# Patient Record
Sex: Male | Born: 1985 | Race: White | Hispanic: No | Marital: Single | Smoking: Never smoker
Health system: Southern US, Community
[De-identification: ages and names within clinical notes are randomized; demographics above are authoritative.]

## PROBLEM LIST (undated history)

## (undated) DIAGNOSIS — Z9109 Other allergy status, other than to drugs and biological substances: Secondary | ICD-10-CM

## (undated) DIAGNOSIS — K589 Irritable bowel syndrome without diarrhea: Secondary | ICD-10-CM

## (undated) DIAGNOSIS — F411 Generalized anxiety disorder: Secondary | ICD-10-CM

## (undated) DIAGNOSIS — J309 Allergic rhinitis, unspecified: Secondary | ICD-10-CM

## (undated) DIAGNOSIS — J45909 Unspecified asthma, uncomplicated: Secondary | ICD-10-CM

## (undated) DIAGNOSIS — E669 Obesity, unspecified: Secondary | ICD-10-CM

## (undated) DIAGNOSIS — F909 Attention-deficit hyperactivity disorder, unspecified type: Secondary | ICD-10-CM

## (undated) DIAGNOSIS — J019 Acute sinusitis, unspecified: Secondary | ICD-10-CM

## (undated) HISTORY — DX: Allergic rhinitis, unspecified: J30.9

## (undated) HISTORY — DX: Unspecified asthma, uncomplicated: J45.909

## (undated) HISTORY — DX: Obesity, unspecified: E66.9

## (undated) HISTORY — DX: Attention-deficit hyperactivity disorder, unspecified type: F90.9

## (undated) HISTORY — DX: Acute sinusitis, unspecified: J01.90

## (undated) HISTORY — DX: Generalized anxiety disorder: F41.1

## (undated) HISTORY — DX: Other allergy status, other than to drugs and biological substances: Z91.09

## (undated) HISTORY — DX: Irritable bowel syndrome without diarrhea: K58.9

## (undated) HISTORY — PX: TONSILLECTOMY: SUR1361

## (undated) HISTORY — PX: STOMACH SURGERY: SHX791

## (undated) HISTORY — PX: UPPER GASTROINTESTINAL ENDOSCOPY: SHX188

---
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
## 2007-03-19 ENCOUNTER — Encounter: Payer: Self-pay | Admitting: Internal Medicine

## 2007-03-19 DIAGNOSIS — Z9109 Other allergy status, other than to drugs and biological substances: Secondary | ICD-10-CM | POA: Insufficient documentation

## 2007-03-19 DIAGNOSIS — K589 Irritable bowel syndrome without diarrhea: Secondary | ICD-10-CM | POA: Insufficient documentation

## 2007-03-19 DIAGNOSIS — E669 Obesity, unspecified: Secondary | ICD-10-CM | POA: Insufficient documentation

## 2007-03-19 HISTORY — DX: Obesity, unspecified: E66.9

## 2007-03-19 HISTORY — DX: Other allergy status, other than to drugs and biological substances: Z91.09

## 2007-03-19 HISTORY — DX: Irritable bowel syndrome, unspecified: K58.9

## 2007-03-21 DIAGNOSIS — J309 Allergic rhinitis, unspecified: Secondary | ICD-10-CM | POA: Insufficient documentation

## 2007-03-21 DIAGNOSIS — F411 Generalized anxiety disorder: Secondary | ICD-10-CM

## 2007-03-21 DIAGNOSIS — F419 Anxiety disorder, unspecified: Secondary | ICD-10-CM | POA: Insufficient documentation

## 2007-03-21 HISTORY — DX: Allergic rhinitis, unspecified: J30.9

## 2007-03-21 HISTORY — DX: Generalized anxiety disorder: F41.1

## 2007-05-12 ENCOUNTER — Ambulatory Visit: Payer: Self-pay | Admitting: Internal Medicine

## 2007-05-12 ENCOUNTER — Encounter: Payer: Self-pay | Admitting: Internal Medicine

## 2008-03-01 ENCOUNTER — Ambulatory Visit: Payer: Self-pay | Admitting: Internal Medicine

## 2008-03-01 DIAGNOSIS — R109 Unspecified abdominal pain: Secondary | ICD-10-CM | POA: Insufficient documentation

## 2008-03-06 LAB — CONVERTED CEMR LAB
ALT: 17 units/L (ref 0–53)
AST: 14 units/L (ref 0–37)
Albumin: 4.6 g/dL (ref 3.5–5.2)
Alkaline Phosphatase: 46 units/L (ref 39–117)
Amylase: 92 units/L (ref 27–131)
BUN: 15 mg/dL (ref 6–23)
Bacteria, UA: NEGATIVE
Basophils Absolute: 0 10*3/uL (ref 0.0–0.1)
Basophils Relative: 0.7 % (ref 0.0–3.0)
Bilirubin Urine: NEGATIVE
Bilirubin, Direct: 0.2 mg/dL (ref 0.0–0.3)
CO2: 30 meq/L (ref 19–32)
Calcium: 9.6 mg/dL (ref 8.4–10.5)
Chloride: 104 meq/L (ref 96–112)
Creatinine, Ser: 1.2 mg/dL (ref 0.4–1.5)
Crystals: NEGATIVE
Eosinophils Absolute: 0.2 10*3/uL (ref 0.0–0.7)
Eosinophils Relative: 2.9 % (ref 0.0–5.0)
GFR calc Af Amer: 99 mL/min
GFR calc non Af Amer: 82 mL/min
Glucose, Bld: 99 mg/dL (ref 70–99)
H Pylori IgG: POSITIVE — AB
HCT: 44 % (ref 39.0–52.0)
Hemoglobin, Urine: NEGATIVE
Hemoglobin: 15.6 g/dL (ref 13.0–17.0)
Ketones, ur: NEGATIVE mg/dL
Leukocytes, UA: NEGATIVE
Lipase: 30 units/L (ref 11.0–59.0)
Lymphocytes Relative: 28.3 % (ref 12.0–46.0)
MCHC: 35.3 g/dL (ref 30.0–36.0)
MCV: 97.8 fL (ref 78.0–100.0)
Monocytes Absolute: 0.6 10*3/uL (ref 0.1–1.0)
Monocytes Relative: 9.1 % (ref 3.0–12.0)
Mucus, UA: NEGATIVE
Neutro Abs: 4.2 10*3/uL (ref 1.4–7.7)
Neutrophils Relative %: 59 % (ref 43.0–77.0)
Nitrite: NEGATIVE
Platelets: 255 10*3/uL (ref 150–400)
Potassium: 4.8 meq/L (ref 3.5–5.1)
RBC: 4.5 M/uL (ref 4.22–5.81)
RDW: 12.1 % (ref 11.5–14.6)
Sodium: 140 meq/L (ref 135–145)
Specific Gravity, Urine: 1.015 (ref 1.000–1.03)
Squamous Epithelial / HPF: NEGATIVE /lpf
TSH: 2.39 microintl units/mL (ref 0.35–5.50)
Total Bilirubin: 1.7 mg/dL — ABNORMAL HIGH (ref 0.3–1.2)
Total Protein, Urine: NEGATIVE mg/dL
Total Protein: 7.6 g/dL (ref 6.0–8.3)
Urine Glucose: NEGATIVE mg/dL
Urobilinogen, UA: 0.2 (ref 0.0–1.0)
WBC: 7 10*3/uL (ref 4.5–10.5)
pH: 5 (ref 5.0–8.0)

## 2008-03-07 ENCOUNTER — Encounter: Payer: Self-pay | Admitting: Internal Medicine

## 2008-05-16 ENCOUNTER — Ambulatory Visit: Payer: Self-pay | Admitting: Internal Medicine

## 2008-12-11 ENCOUNTER — Ambulatory Visit: Payer: Self-pay | Admitting: Internal Medicine

## 2008-12-12 DIAGNOSIS — J45909 Unspecified asthma, uncomplicated: Secondary | ICD-10-CM

## 2008-12-12 HISTORY — DX: Unspecified asthma, uncomplicated: J45.909

## 2009-12-27 ENCOUNTER — Ambulatory Visit: Payer: Self-pay | Admitting: Internal Medicine

## 2009-12-27 DIAGNOSIS — J019 Acute sinusitis, unspecified: Secondary | ICD-10-CM | POA: Insufficient documentation

## 2009-12-27 HISTORY — DX: Acute sinusitis, unspecified: J01.90

## 2010-02-19 ENCOUNTER — Ambulatory Visit: Payer: Self-pay | Admitting: Internal Medicine

## 2010-02-19 DIAGNOSIS — H6690 Otitis media, unspecified, unspecified ear: Secondary | ICD-10-CM | POA: Insufficient documentation

## 2010-02-21 ENCOUNTER — Ambulatory Visit: Payer: Self-pay | Admitting: Internal Medicine

## 2010-09-03 NOTE — Assessment & Plan Note (Signed)
Summary: TETANUS SHOT/Aadhav Uhlig/CD   Nurse Visit   Allergies: 1)  ! Benadryl  Immunization History:  Hepatitis B Immunization History:    Hepatitis B # 1:  historical (05/14/1999)    Hepatitis B # 2:  historical (07/12/1999)    Hepatitis B # 3:  historical (11/07/1999)  DPT Immunization History:    DPT # 1:  dpt (04/29/1988)    DPT # 2:  dpt (07/04/1988)    DPT # 3:  dpt (09/19/1988)    DPT # 4:  dpt (05/27/1989)    DPT # 5:  dpt (11/30/1992)  HIB Immunization History:    HIB # 1:  hib (01/29/1990)  Polio Immunization History:    Polio # 1:  historical (04/29/1988)    Polio # 2:  historical (07/04/1988)    Polio # 3:  historical (05/27/1989)    Polio # 4:  historical (11/30/1992)  MMR Immunization History:    MMR # 1:  mmr (05/27/1989)    MMR # 2:  mmr (11/30/1992)  Immunizations Administered:  Tetanus Vaccine:    Vaccine Type: Tdap    Site: right deltoid    Mfr: Merck    Dose: 0.5 ml    Route: IM    Given by: Lamar Sprinkles, CMA    Exp. Date: 10/26/2011    Lot #: [id number]    VIS given: 06/22/07 version given February 21, 2010.  Orders Added: 1)  Tdap => 22yrs IM [90715] 2)  Admin 1st Vaccine [84132]

## 2010-09-03 NOTE — Assessment & Plan Note (Signed)
Summary: ?SINUS INFECTION-LB   Vital Signs:  Patient profile:   25 year old male Height:      69 inches Weight:      183 pounds BMI:     27.12 O2 Sat:      97 % on Room air Temp:     98.7 degrees F oral Pulse rate:   75 / minute BP sitting:   102 / 68  (left arm) Cuff size:   regular  Vitals Entered By: Zella Ball Ewing CMA Duncan Dull) (February 19, 2010 2:42 PM)  O2 Flow:  Room air CC: Sinus Infection/RE   CC:  Sinus Infection/RE.  History of Present Illness: here with 2 days onset acute mild to mod facial pain, pressure, fever and greenish d/c, on top of several wk more clear nasal d/c and sinus congestion with sneezing;  no increased wheezing or sob/doe or nighttime awakening;  Pt denies CP, sob, doe, wheezing, orthopnea, pnd, worsening LE edema, palps, dizziness or syncope   Overall good complaicne with meds, good tolerance.    Problems Prior to Update: 1)  Otitis Media, Left  (ICD-382.9) 2)  Sinusitis- Acute-nos  (ICD-461.9) 3)  Sinusitis- Acute-nos  (ICD-461.9) 4)  Asthma  (ICD-493.90) 5)  Abdominal Pain Other Specified Site  (ICD-789.09) 6)  Family History of Asthma  (ICD-V17.5) 7)  Anxiety  (ICD-300.00) 8)  Allergic Rhinitis  (ICD-477.9) 9)  Obesity  (ICD-278.00) 10)  Allergy, Hx of  (ICD-V15.09) 11)  Ibs  (ICD-564.1)  Medications Prior to Update: 1)  Omeprazole 20 Mg  Cpdr (Omeprazole) .Marland Kitchen.. 1 By Mouth Two Times A Day 2)  Prednisone 10 Mg Tabs (Prednisone) .... 3po Qd For 3days, Then 2po Qd For 3days, Then 1po Qd For 3days, Then Stop 3)  Proair Hfa 108 (90 Base) Mcg/act Aers (Albuterol Sulfate) .... 2 Puffs Four Times Per Day 4)  Advair Diskus 100-50 Mcg/dose Misc (Fluticasone-Salmeterol) .Marland Kitchen.. 1 Puff Two Times A Day 5)  Cephalexin 500 Mg Caps (Cephalexin) .Marland Kitchen.. 1po Three Times A Day 6)  Sudahist 12-120 Mg Xr12h-Tab (Chlorpheniramine-Pseudoeph) .Marland Kitchen.. 1 By Mouth Two Times A Day As Needed  Current Medications (verified): 1)  Omeprazole 20 Mg  Cpdr (Omeprazole) .Marland Kitchen.. 1 By Mouth Two  Times A Day 2)  Proair Hfa 108 (90 Base) Mcg/act Aers (Albuterol Sulfate) .... 2 Puffs Four Times Per Day 3)  Advair Diskus 100-50 Mcg/dose Misc (Fluticasone-Salmeterol) .Marland Kitchen.. 1 Puff Two Times A Day 4)  Augmentin 875-125 Mg Tabs (Amoxicillin-Pot Clavulanate) .... Generic - 1 By Mouth Two Times A Day 5)  Sudahist 12-120 Mg Xr12h-Tab (Chlorpheniramine-Pseudoeph) .Marland Kitchen.. 1 By Mouth Two Times A Day As Needed 6)  Allegra 180 Mg Tabs (Fexofenadine Hcl)  Allergies (verified): 1)  ! Benadryl  Past History:  Past Medical History: Last updated: 12/11/2008 Allergic rhinitis Obesity IBS Anxiety Asthma  Past Surgical History: Last updated: 03/01/2008 Tonsillectomy s/p gastric surgury as toddler for malposition  Social History: Last updated: 12/27/2009 Never Smoked Alcohol use-no Single college student Drug use-no  Risk Factors: Smoking Status: never (05/12/2007)  Review of Systems       all otherwise negative per pt -    Physical Exam  General:  alert and well-developed.  , mild ill  Head:  normocephalic and atraumatic.   Eyes:  vision grossly intact, pupils equal, and pupils round.   Ears:  bilat tm's red, sinus tender bilat Nose:  nasal dischargemucosal pallor and mucosal edema.   Mouth:  pharyngeal erythema and fair dentition.   Neck:  supple  and no masses.   Lungs:  normal respiratory effort and normal breath sounds.   Heart:  normal rate and regular rhythm.   Extremities:  no edema, no erythema    Impression & Recommendations:  Problem # 1:  SINUSITIS- ACUTE-NOS (ICD-461.9)  His updated medication list for this problem includes:    Augmentin 875-125 Mg Tabs (Amoxicillin-pot clavulanate) .Marland Kitchen... Generic - 1 by mouth two times a day    Sudahist 12-120 Mg Xr12h-tab (Chlorpheniramine-pseudoeph) .Marland Kitchen... 1 by mouth two times a day as needed treat as above, f/u any worsening signs or symptoms   Problem # 2:  ASTHMA (ICD-493.90)  The following medications were removed from  the medication list:    Prednisone 10 Mg Tabs (Prednisone) .Marland Kitchen... 3po qd for 3days, then 2po qd for 3days, then 1po qd for 3days, then stop His updated medication list for this problem includes:    Proair Hfa 108 (90 Base) Mcg/act Aers (Albuterol sulfate) .Marland Kitchen... 2 puffs four times per day    Advair Diskus 100-50 Mcg/dose Misc (Fluticasone-salmeterol) .Marland Kitchen... 1 puff two times a day stable overall by hx and exam, ok to continue meds/tx as is   Problem # 3:  ALLERGIC RHINITIS (ICD-477.9)  ok for OTC med such as allegra OTC as needed   His updated medication list for this problem includes:    Allegra 180 Mg Tabs (Fexofenadine hcl)  Complete Medication List: 1)  Omeprazole 20 Mg Cpdr (Omeprazole) .Marland Kitchen.. 1 by mouth two times a day 2)  Proair Hfa 108 (90 Base) Mcg/act Aers (Albuterol sulfate) .... 2 puffs four times per day 3)  Advair Diskus 100-50 Mcg/dose Misc (Fluticasone-salmeterol) .Marland Kitchen.. 1 puff two times a day 4)  Augmentin 875-125 Mg Tabs (Amoxicillin-pot clavulanate) .... Generic - 1 by mouth two times a day 5)  Sudahist 12-120 Mg Xr12h-tab (Chlorpheniramine-pseudoeph) .Marland Kitchen.. 1 by mouth two times a day as needed 6)  Allegra 180 Mg Tabs (Fexofenadine hcl)  Patient Instructions: 1)  Please take all new medications as prescribed 2)  Continue all previous medications as before this visit  3)  You can also use Mucinex OTC or it's generic for congestion  4)  Please schedule a follow-up appointment as needed. Prescriptions: SUDAHIST 12-120 MG XR12H-TAB (CHLORPHENIRAMINE-PSEUDOEPH) 1 by mouth two times a day as needed  #60 x 1   Entered and Authorized by:   Corwin Levins MD   Signed by:   Corwin Levins MD on 02/19/2010   Method used:   Print then Give to Patient   RxID:   [id number] AUGMENTIN 875-125 MG TABS (AMOXICILLIN-POT CLAVULANATE) generic - 1 by mouth two times a day  #20 x 0   Entered and Authorized by:   Corwin Levins MD   Signed by:   Corwin Levins MD on 02/19/2010   Method used:    Electronically to        [facility] [phone]* (retail)       [address]       [place], [place]  [address]       Ph: [phone]       Fax: [phone]   RxID:   [id number]

## 2010-09-03 NOTE — Assessment & Plan Note (Signed)
Summary: cold symptoms  fever---stc   Vital Signs:  Patient profile:   25 year old male Height:      69 inches Weight:      187 pounds BMI:     27.71 O2 Sat:      97 % on Room air Temp:     98.3 degrees F oral Pulse rate:   62 / minute BP sitting:   100 / 58  (left arm) Cuff size:   regular  Vitals Entered ByZella Ball Ewing (Dec 27, 2009 2:16 PM)  O2 Flow:  Room air CC: Congestion, fever, ears stopped up/RE   CC:  Congestion, fever, and ears stopped up/RE.  History of Present Illness: here with acute onset mild to mod x 3 days facial pain, pressure,fever and greenish d/c, with left ear pain with mild popping but no hearing loss, vertigo or n/v., all on top of more chronic nasal allergy symtpoms with itch, sneeze adn mild congestion.   Pt denies CP, sob, doe, wheezing, orthopnea, pnd, worsening LE edema, palps, dizziness or syncope   Pt denies new neuro symptoms such as headache, facial or extremity weakness   Preventive Screening-Counseling & Management      Drug Use:  no.    Problems Prior to Update: 1)  Sinusitis- Acute-nos  (ICD-461.9) 2)  Asthma  (ICD-493.90) 3)  Abdominal Pain Other Specified Site  (ICD-789.09) 4)  Family History of Asthma  (ICD-V17.5) 5)  Anxiety  (ICD-300.00) 6)  Allergic Rhinitis  (ICD-477.9) 7)  Obesity  (ICD-278.00) 8)  Allergy, Hx of  (ICD-V15.09) 9)  Ibs  (ICD-564.1)  Medications Prior to Update: 1)  Omeprazole 20 Mg  Cpdr (Omeprazole) .Marland Kitchen.. 1 By Mouth Two Times A Day 2)  Prednisone 10 Mg Tabs (Prednisone) .... 3po Qd For 3days, Then 2po Qd For 3days, Then 1po Qd For 3days, Then Stop 3)  Proair Hfa 108 (90 Base) Mcg/act Aers (Albuterol Sulfate) .... 2 Puffs Four Times Per Day 4)  Advair Diskus 100-50 Mcg/dose Misc (Fluticasone-Salmeterol) .Marland Kitchen.. 1 Puff Two Times A Day  Current Medications (verified): 1)  Omeprazole 20 Mg  Cpdr (Omeprazole) .Marland Kitchen.. 1 By Mouth Two Times A Day 2)  Prednisone 10 Mg Tabs (Prednisone) .... 3po Qd For 3days, Then 2po Qd  For 3days, Then 1po Qd For 3days, Then Stop 3)  Proair Hfa 108 (90 Base) Mcg/act Aers (Albuterol Sulfate) .... 2 Puffs Four Times Per Day 4)  Advair Diskus 100-50 Mcg/dose Misc (Fluticasone-Salmeterol) .Marland Kitchen.. 1 Puff Two Times A Day 5)  Cephalexin 500 Mg Caps (Cephalexin) .Marland Kitchen.. 1po Three Times A Day 6)  Sudahist 12-120 Mg Xr12h-Tab (Chlorpheniramine-Pseudoeph) .Marland Kitchen.. 1 By Mouth Two Times A Day As Needed  Allergies (verified): 1)  ! Benadryl  Past History:  Past Medical History: Last updated: 12/11/2008 Allergic rhinitis Obesity IBS Anxiety Asthma  Past Surgical History: Last updated: 03/01/2008 Tonsillectomy s/p gastric surgury as toddler for malposition  Social History: Last updated: 12/27/2009 Never Smoked Alcohol use-no Single college student Drug use-no  Risk Factors: Smoking Status: never (05/12/2007)  Social History: Never Smoked Alcohol use-no Single college student Drug use-no Drug Use:  no  Review of Systems       all otherwise negative per pt -    Physical Exam  General:  alert and overweight-appearing. , mild ill  Head:  normocephalic and atraumatic.   Eyes:  vision grossly intact, pupils equal, and pupils round.   Ears:  left tm severe erythema, right tm ok, sinus tender bilat Nose:  nasal dischargemucosal pallor and mucosal edema.   Mouth:  pharyngeal erythema and fair dentition.   Neck:  supple and no masses.   Lungs:  normal respiratory effort and normal breath sounds.   Heart:  normal rate and regular rhythm.   Extremities:  no edema, no erythema    Impression & Recommendations:  Problem # 1:  SINUSITIS- ACUTE-NOS (ICD-461.9)  and left otitis - treat as above, f/u any worsening signs or symptoms , and sudahist, and otc mucinex as needed   His updated medication list for this problem includes:    Cephalexin 500 Mg Caps (Cephalexin) .Marland Kitchen... 1po three times a day    Sudahist 12-120 Mg Xr12h-tab (Chlorpheniramine-pseudoeph) .Marland Kitchen... 1 by mouth two  times a day as needed  Problem # 2:  ASTHMA (ICD-493.90)  His updated medication list for this problem includes:    Prednisone 10 Mg Tabs (Prednisone) .Marland Kitchen... 3po qd for 3days, then 2po qd for 3days, then 1po qd for 3days, then stop    Proair Hfa 108 (90 Base) Mcg/act Aers (Albuterol sulfate) .Marland Kitchen... 2 puffs four times per day    Advair Diskus 100-50 Mcg/dose Misc (Fluticasone-salmeterol) .Marland Kitchen... 1 puff two times a day stable overall by hx and exam, ok to continue meds/tx as is   Problem # 3:  ALLERGIC RHINITIS (ICD-477.9) d/w pt - for otc claritin as needed , consider singulair  Complete Medication List: 1)  Omeprazole 20 Mg Cpdr (Omeprazole) .Marland Kitchen.. 1 by mouth two times a day 2)  Prednisone 10 Mg Tabs (Prednisone) .... 3po qd for 3days, then 2po qd for 3days, then 1po qd for 3days, then stop 3)  Proair Hfa 108 (90 Base) Mcg/act Aers (Albuterol sulfate) .... 2 puffs four times per day 4)  Advair Diskus 100-50 Mcg/dose Misc (Fluticasone-salmeterol) .Marland Kitchen.. 1 puff two times a day 5)  Cephalexin 500 Mg Caps (Cephalexin) .Marland Kitchen.. 1po three times a day 6)  Sudahist 12-120 Mg Xr12h-tab (Chlorpheniramine-pseudoeph) .Marland Kitchen.. 1 by mouth two times a day as needed  Patient Instructions: 1)  Please take all new medications as prescribed 2)  Continue all previous medications as before this visit  3)  Please schedule a follow-up appointment as needed. Prescriptions: SUDAHIST 12-120 MG XR12H-TAB (CHLORPHENIRAMINE-PSEUDOEPH) 1 by mouth two times a day as needed  #60 x 0   Entered and Authorized by:   Corwin Levins MD   Signed by:   Corwin Levins MD on 12/27/2009   Method used:   Print then Give to Patient   RxID:   [id number] CEPHALEXIN 500 MG CAPS (CEPHALEXIN) 1po three times a day  #30 x 0   Entered and Authorized by:   Corwin Levins MD   Signed by:   Corwin Levins MD on 12/27/2009   Method used:   Print then Give to Patient   RxID:   [id number]

## 2011-05-31 DIAGNOSIS — Z0279 Encounter for issue of other medical certificate: Secondary | ICD-10-CM

## 2011-06-19 ENCOUNTER — Encounter: Payer: Self-pay | Admitting: Internal Medicine

## 2011-06-19 ENCOUNTER — Ambulatory Visit (INDEPENDENT_AMBULATORY_CARE_PROVIDER_SITE_OTHER): Payer: BC Managed Care – PPO | Admitting: Internal Medicine

## 2011-06-19 VITALS — BP 112/78 | HR 57 | Temp 98.2°F | Ht 67.0 in | Wt 178.1 lb

## 2011-06-19 DIAGNOSIS — Z0000 Encounter for general adult medical examination without abnormal findings: Secondary | ICD-10-CM | POA: Insufficient documentation

## 2011-06-19 DIAGNOSIS — J45909 Unspecified asthma, uncomplicated: Secondary | ICD-10-CM

## 2011-06-19 DIAGNOSIS — J019 Acute sinusitis, unspecified: Secondary | ICD-10-CM

## 2011-06-19 DIAGNOSIS — J309 Allergic rhinitis, unspecified: Secondary | ICD-10-CM

## 2011-06-19 DIAGNOSIS — Z0001 Encounter for general adult medical examination with abnormal findings: Secondary | ICD-10-CM | POA: Insufficient documentation

## 2011-06-19 MED ORDER — AZITHROMYCIN 250 MG PO TABS: ORAL_TABLET | ORAL | Status: AC

## 2011-06-19 NOTE — Patient Instructions (Signed)
Take all new medications as prescribed Continue all other medications as before You can also take Delsym OTC for cough, and/or Mucinex (or it's generic off brand) for congestion  

## 2011-06-22 ENCOUNTER — Encounter: Payer: Self-pay | Admitting: Internal Medicine

## 2011-06-22 NOTE — Assessment & Plan Note (Signed)
Mild to mod, for antibx course,  to f/u any worsening symptoms or concerns 

## 2011-06-22 NOTE — Assessment & Plan Note (Signed)
stable overall by hx and exam, most recent data reviewed with pt, and pt to continue medical treatment as before - to restart meds

## 2011-06-22 NOTE — Assessment & Plan Note (Signed)
stable overall by hx and exam, most recent data reviewed with pt, and pt to continue medical treatment as before  SpO2 Readings from Last 3 Encounters:  06/19/11 98%  02/19/10 97%  12/27/09 97%

## 2011-06-22 NOTE — Progress Notes (Signed)
  Subjective:    Patient ID: Maxwell Herrera, male    DOB: Sep 02, 1985, 25 y.o.   MRN: [id number]  HPI   Here with 3 days acute onset fever, facial pain, pressure, general weakness and malaise, and greenish d/c, with slight ST, but little to no cough and Pt denies chest pain, increased sob or doe, wheezing, orthopnea, PND, increased LE swelling, palpitations, dizziness or syncope.  Does have several wks ongoing nasal allergy symptoms with clear congestion, itch and sneeze, without fever, pain, ST, cough or wheezing.  Pt denies new neurological symptoms such as new headache, or facial or extremity weakness or numbness   Pt denies polydipsia, polyuria, or low sugar symptoms such as weakness or confusion improved with po intake.  Pt states overall good compliance with meds, trying to follow lower cholesterol, diabetic diet, wt overall stable but little exercise however.    Past Medical History  Diagnosis Date  . ALLERGIC RHINITIS 03/21/2007  . ALLERGY, HX OF 03/19/2007  . ANXIETY 03/21/2007  . ASTHMA 12/12/2008  . IBS 03/19/2007  . OBESITY 03/19/2007  . SINUSITIS- ACUTE-NOS 12/27/2009   Past Surgical History  Procedure Date  . Tonsillectomy   . Stomach surgery     s/p as toddler for malposition    reports that he has never smoked. He does not have any smokeless tobacco history on file. He reports that he does not drink alcohol or use illicit drugs. family history includes Asthma in his other. Allergies  Allergen Reactions  . Diphenhydramine Hcl    Review of Systems Review of Systems  Constitutional: Negative for diaphoresis and unexpected weight change.  HENT: Negative for drooling and tinnitus.   Eyes: Negative for photophobia and visual disturbance.  Respiratory: Negative for choking and stridor.   Gastrointestinal: Negative for vomiting and blood in stool.  Genitourinary: Negative for hematuria and decreased urine volume.      Objective:   Physical Exam BP 112/78  Pulse 57  Temp(Src)  98.2 F (36.8 C) (Oral)  Ht 5\' 7"  (1.702 m)  Wt 178 lb 1.9 oz (80.795 kg)  BMI 27.90 kg/m2  SpO2 98% Physical Exam  VS noted, mild ill Constitutional: Pt appears well-developed and well-nourished.  HENT: Head: Normocephalic.  Right Ear: External ear normal.  Left Ear: External ear normal.  Bilat tm's mild erythema.  Sinus tender bilat maxillary.  Pharynx mild erythema Eyes: Conjunctivae and EOM are normal. Pupils are equal, round, and reactive to light.  Neck: Normal range of motion. Neck supple.  Cardiovascular: Normal rate and regular rhythm.   Pulmonary/Chest: Effort normal and breath sounds normal.  Neurological: Pt is alert. No cranial nerve deficit.  Skin: Skin is warm. No erythema.  Psychiatric: Pt behavior is normal. Thought content normal. not nervous or depressed affect    Assessment & Plan:

## 2011-08-11 ENCOUNTER — Encounter: Payer: Self-pay | Admitting: Internal Medicine

## 2011-08-11 ENCOUNTER — Ambulatory Visit (INDEPENDENT_AMBULATORY_CARE_PROVIDER_SITE_OTHER): Payer: BC Managed Care – PPO | Admitting: Internal Medicine

## 2011-08-11 VITALS — BP 110/70 | HR 52 | Temp 98.3°F | Ht 68.0 in | Wt 181.0 lb

## 2011-08-11 DIAGNOSIS — H6690 Otitis media, unspecified, unspecified ear: Secondary | ICD-10-CM

## 2011-08-11 DIAGNOSIS — H6692 Otitis media, unspecified, left ear: Secondary | ICD-10-CM | POA: Insufficient documentation

## 2011-08-11 DIAGNOSIS — J45909 Unspecified asthma, uncomplicated: Secondary | ICD-10-CM

## 2011-08-11 DIAGNOSIS — F909 Attention-deficit hyperactivity disorder, unspecified type: Secondary | ICD-10-CM

## 2011-08-11 HISTORY — DX: Attention-deficit hyperactivity disorder, unspecified type: F90.9

## 2011-08-11 MED ORDER — ALBUTEROL SULFATE HFA 108 (90 BASE) MCG/ACT IN AERS: 2.0000 | INHALATION_SPRAY | Freq: Four times a day (QID) | RESPIRATORY_TRACT | Status: DC

## 2011-08-11 MED ORDER — LEVOFLOXACIN 250 MG PO TABS: 250.0000 mg | ORAL_TABLET | Freq: Every day | ORAL | Status: AC

## 2011-08-11 MED ORDER — AMPHETAMINE-DEXTROAMPHET ER 20 MG PO CP24: 20.0000 mg | ORAL_CAPSULE | ORAL | Status: DC

## 2011-08-11 MED ORDER — HYDROCODONE-HOMATROPINE 5-1.5 MG/5ML PO SYRP: 5.0000 mL | ORAL_SOLUTION | Freq: Four times a day (QID) | ORAL | Status: AC | PRN

## 2011-08-11 NOTE — Progress Notes (Signed)
Subjective:    Patient ID: Maxwell Herrera, male    DOB: 1986/04/09, 26 y.o.   MRN: [id number]  HPI   Here with 3 days acute onset fever, facial  pressure, general weakness and malaise with mild ST but severe left ear pain, but little to no cough and Pt denies chest pain, increased sob or doe, wheezing, orthopnea, PND, increased LE swelling, palpitations, dizziness or syncope.  Pt denies new neurological symptoms such as new headache, or facial or extremity weakness or numbness   Pt denies polydipsia, polyuria.   Pt denies fever, wt loss, night sweats, loss of appetite, or other constitutional symptoms except for the above.  Left ear achesas well.  Also with hx of ADHD, job becoming more demanding with more responsibility (working as Consulting civil engineer for [employer], office based) and would like to re-start tx.  Has taken adderal, last at approx 26yo with good results academically.   Past Medical History  Diagnosis Date  . ALLERGIC RHINITIS 03/21/2007  . ALLERGY, HX OF 03/19/2007  . ANXIETY 03/21/2007  . ASTHMA 12/12/2008  . IBS 03/19/2007  . OBESITY 03/19/2007  . SINUSITIS- ACUTE-NOS 12/27/2009  . ADHD (attention deficit hyperactivity disorder) 08/11/2011   Past Surgical History  Procedure Date  . Tonsillectomy   . Stomach surgery     s/p as toddler for malposition    reports that he has never smoked. He does not have any smokeless tobacco history on file. He reports that he does not drink alcohol or use illicit drugs. family history includes Asthma in his other. Allergies  Allergen Reactions  . Diphenhydramine Hcl    Current Outpatient Prescriptions on File Prior to Visit  Medication Sig Dispense Refill  . Chlorpheniramine-Pseudoeph (SUDAHIST) 12-120 MG TB12 Take 1 tablet by mouth 2 (two) times daily as needed.        . fexofenadine (ALLEGRA) 180 MG tablet Take 180 mg by mouth daily.        . Fluticasone-Salmeterol (ADVAIR DISKUS) 100-50 MCG/DOSE AEPB Inhale 1 puff into the lungs 2 (two) times daily.          Marland Kitchen omeprazole (PRILOSEC) 20 MG capsule Take 20 mg by mouth 2 (two) times daily.         Review of Systems Review of Systems  Constitutional: Negative for diaphoresis and unexpected weight change.  HENT: Negative for drooling and tinnitus.   Eyes: Negative for photophobia and visual disturbance.  Respiratory: Negative for choking and stridor.   Gastrointestinal: Negative for vomiting and blood in stool.  Genitourinary: Negative for hematuria and decreased urine volume.     Objective:   Physical Exam BP 110/70  Pulse 52  Temp(Src) 98.3 F (36.8 C) (Oral)  Ht 5\' 8"  (1.727 m)  Wt 181 lb (82.101 kg)  BMI 27.52 kg/m2  SpO2 97% Physical Exam  VS noted Constitutional: Pt appears well-developed and well-nourished.  HENT: Head: Normocephalic.  Right Ear: External ear normal.  Left Ear: External ear normal.  Bilat tm's mild erythema.  Sinus nontender.  Pharynx mild erythema left MT severe erythema, bulging Eyes: Conjunctivae and EOM are normal. Pupils are equal, round, and reactive to light.  Neck: Normal range of motion. Neck supple.  Cardiovascular: Normal rate and regular rhythm.   Pulmonary/Chest: Effort normal and breath sounds normal.  Neurological: Pt is alert. No cranial nerve deficit.  Skin: Skin is warm. No erythema.  Psychiatric: Pt behavior is normal. Thought content normal. 1+ nervous    Assessment & Plan:

## 2011-08-11 NOTE — Assessment & Plan Note (Signed)
Treatment with adderall xr 20 qd,  to f/u any worsening symptoms or concerns

## 2011-08-11 NOTE — Assessment & Plan Note (Signed)
Moderate to severe, for antibx course,  to f/u any worsening symptoms or concerns

## 2011-08-11 NOTE — Patient Instructions (Addendum)
Take all new medications as prescribed, including the adderall XR 20 mg Please call if the adderall works OK for refills Continue all other medications as before You can also take Mucinex (or it's generic off brand) for congestion

## 2011-08-11 NOTE — Assessment & Plan Note (Signed)
stable overall by hx and exam, most recent data reviewed with pt, and pt to continue medical treatment as before, for inhaler refill SpO2 Readings from Last 3 Encounters:  08/11/11 97%  06/19/11 98%  02/19/10 97%

## 2011-08-27 ENCOUNTER — Telehealth: Payer: Self-pay

## 2011-08-27 NOTE — Telephone Encounter (Signed)
Initiated approval of Adderall and was approved from Jan. 2, 2013 through Jan. 23, 2014 Case number is [id number]. Called pharmacy informed of approval. Also called patient left message regarding approval.

## 2011-11-19 ENCOUNTER — Encounter: Payer: Self-pay | Admitting: Internal Medicine

## 2011-11-19 ENCOUNTER — Ambulatory Visit (INDEPENDENT_AMBULATORY_CARE_PROVIDER_SITE_OTHER): Payer: BC Managed Care – PPO | Admitting: Internal Medicine

## 2011-11-19 ENCOUNTER — Other Ambulatory Visit (INDEPENDENT_AMBULATORY_CARE_PROVIDER_SITE_OTHER): Payer: BC Managed Care – PPO

## 2011-11-19 VITALS — BP 118/72 | HR 53 | Temp 97.5°F | Ht 69.0 in | Wt 177.5 lb

## 2011-11-19 DIAGNOSIS — F909 Attention-deficit hyperactivity disorder, unspecified type: Secondary | ICD-10-CM

## 2011-11-19 DIAGNOSIS — Z0000 Encounter for general adult medical examination without abnormal findings: Secondary | ICD-10-CM

## 2011-11-19 LAB — BASIC METABOLIC PANEL
BUN: 19 mg/dL (ref 6–23)
GFR: 89.47 mL/min (ref >60.00)
Potassium: 4.1 mEq/L (ref 3.5–5.1)
Sodium: 140 mEq/L (ref 135–145)

## 2011-11-19 LAB — CBC WITH DIFFERENTIAL/PLATELET
Basophils Absolute: 0 10*3/uL (ref 0.0–0.1)
Basophils Relative: 0.6 % (ref 0.0–3.0)
Eosinophils Relative: 1.7 % (ref 0.0–5.0)
Hemoglobin: 15 g/dL (ref 13.0–17.0)
Lymphocytes Relative: 30 % (ref 12.0–46.0)
Monocytes Relative: 9.7 % (ref 3.0–12.0)
Neutro Abs: 4.5 10*3/uL (ref 1.4–7.7)
RBC: 4.5 Mil/uL (ref 4.22–5.81)
RDW: 13 % (ref 11.5–14.6)
WBC: 7.8 10*3/uL (ref 4.5–10.5)

## 2011-11-19 LAB — HEPATIC FUNCTION PANEL
AST: 14 U/L (ref 0–37)
Bilirubin, Direct: 0.2 mg/dL (ref 0.0–0.3)
Total Bilirubin: 1.2 mg/dL (ref 0.3–1.2)

## 2011-11-19 LAB — URINALYSIS, ROUTINE W REFLEX MICROSCOPIC
Leukocytes, UA: NEGATIVE
Specific Gravity, Urine: 1.015 (ref 1.000–1.030)
Urine Glucose: NEGATIVE
Urobilinogen, UA: 0.2 (ref 0.0–1.0)
pH: 6 (ref 5.0–8.0)

## 2011-11-19 LAB — LIPID PANEL
Cholesterol: 152 mg/dL (ref 0–200)
LDL Cholesterol: 95 mg/dL (ref 0–99)
Total CHOL/HDL Ratio: 5
VLDL: 24.2 mg/dL (ref 0.0–40.0)

## 2011-11-19 MED ORDER — AMPHETAMINE-DEXTROAMPHET ER 20 MG PO CP24: 20.0000 mg | ORAL_CAPSULE | ORAL | Status: DC

## 2011-11-19 NOTE — Patient Instructions (Signed)
Take all new medications as prescribed Continue all other medications as before Please have the pharmacy call with any refills you may need. Please go to LAB in the Basement for the blood and/or urine tests to be done today You will be contacted by phone if any changes need to be made immediately.  Otherwise, you will receive a letter about your results with an explanation. Please return in 1 year for your yearly visit, or sooner if needed, with Lab testing done 3-5 days before  

## 2011-11-23 ENCOUNTER — Encounter: Payer: Self-pay | Admitting: Internal Medicine

## 2011-11-23 NOTE — Progress Notes (Signed)
Subjective:    Patient ID: Maxwell Herrera, male    DOB: 1986-02-26, 26 y.o.   MRN: [id number]  HPI  Here for wellness and f/u;  Overall doing ok;  Pt denies CP, worsening SOB, DOE, wheezing, orthopnea, PND, worsening LE edema, palpitations, dizziness or syncope.  Pt denies neurological change such as new Headache, facial or extremity weakness.  Pt denies polydipsia, polyuria, or low sugar symptoms. Pt states overall good compliance with treatment and medications, good tolerability, and trying to follow lower cholesterol diet.  Pt denies worsening depressive symptoms, suicidal ideation or panic. No fever, wt loss, night sweats, loss of appetite, or other constitutional symptoms.  Pt states good ability with ADL's, low fall risk, home safety reviewed and adequate, no significant changes in hearing or vision, and occasionally active with exercise.  Still works for [employer], gets more work done on [employer], less mistakes, friends notice the improvement including stepdad. Recent wt stable.  Did have minor insomnia with taking a dose to close to bedtime once. Past Medical History  Diagnosis Date  . ALLERGIC RHINITIS 03/21/2007  . ALLERGY, HX OF 03/19/2007  . ANXIETY 03/21/2007  . ASTHMA 12/12/2008  . IBS 03/19/2007  . OBESITY 03/19/2007  . SINUSITIS- ACUTE-NOS 12/27/2009  . ADHD (attention deficit hyperactivity disorder) 08/11/2011   Past Surgical History  Procedure Date  . Tonsillectomy   . Stomach surgery     s/p as toddler for malposition    reports that he has never smoked. He does not have any smokeless tobacco history on file. He reports that he does not drink alcohol or use illicit drugs. family history includes Asthma in his other. Allergies  Allergen Reactions  . Diphenhydramine Hcl    Current Outpatient Prescriptions on File Prior to Visit  Medication Sig Dispense Refill  . albuterol (PROAIR HFA) 108 (90 BASE) MCG/ACT inhaler Inhale 2 puffs into the lungs 4 (four) times daily.  1 Inhaler   5  . Chlorpheniramine-Pseudoeph (SUDAHIST) 12-120 MG TB12 Take 1 tablet by mouth 2 (two) times daily as needed.        . fexofenadine (ALLEGRA) 180 MG tablet Take 180 mg by mouth daily.        . Fluticasone-Salmeterol (ADVAIR DISKUS) 100-50 MCG/DOSE AEPB Inhale 1 puff into the lungs 2 (two) times daily.        Marland Kitchen omeprazole (PRILOSEC) 20 MG capsule Take 20 mg by mouth 2 (two) times daily.        Marland Kitchen amphetamine-dextroamphetamine (ADDERALL XR) 20 MG 24 hr capsule Take 1 capsule (20 mg total) by mouth every morning. To fill January 18, 2012  30 capsule  0   Review of Systems Review of Systems  Constitutional: Negative for diaphoresis, activity change, appetite change and unexpected weight change.  HENT: Negative for hearing loss, ear pain, facial swelling, mouth sores and neck stiffness.   Eyes: Negative for pain, redness and visual disturbance.  Respiratory: Negative for shortness of breath and wheezing.   Cardiovascular: Negative for chest pain and palpitations.  Gastrointestinal: Negative for diarrhea, blood in stool, abdominal distention and rectal pain.  Genitourinary: Negative for hematuria, flank pain and decreased urine volume.  Musculoskeletal: Negative for myalgias and joint swelling.  Skin: Negative for color change and wound.  Neurological: Negative for syncope and numbness.  Hematological: Negative for adenopathy.  Psychiatric/Behavioral: Negative for hallucinations, self-injury, decreased concentration and agitation.      Objective:   Physical Exam BP 118/72  Pulse 53  Temp(Src) 97.5 F (  36.4 C) (Oral)  Ht 5\' 9"  (1.753 m)  Wt 177 lb 8 oz (80.513 kg)  BMI 26.21 kg/m2  SpO2 98% Physical Exam  VS noted Constitutional: Pt is oriented to person, place, and time. Appears well-developed and well-nourished.  HENT:  Head: Normocephalic and atraumatic.  Right Ear: External ear normal.  Left Ear: External ear normal.  Nose: Nose normal.  Mouth/Throat: Oropharynx is clear and  moist.  Eyes: Conjunctivae and EOM are normal. Pupils are equal, round, and reactive to light.  Neck: Normal range of motion. Neck supple. No JVD present. No tracheal deviation present.  Cardiovascular: Normal rate, regular rhythm, normal heart sounds and intact distal pulses.   Pulmonary/Chest: Effort normal and breath sounds normal.  Abdominal: Soft. Bowel sounds are normal. There is no tenderness.  Musculoskeletal: Normal range of motion. Exhibits no edema.  Lymphadenopathy:  Has no cervical adenopathy.  Neurological: Pt is alert and oriented to person, place, and time. Pt has normal reflexes. No cranial nerve deficit.  Skin: Skin is warm and dry. No rash noted.  Psychiatric:  Has  normal mood and affect. Behavior is normal.     Assessment & Plan:

## 2011-11-23 NOTE — Assessment & Plan Note (Signed)

## 2011-11-23 NOTE — Assessment & Plan Note (Signed)
stable overall by hx and exam, most recent data reviewed with pt, and pt to continue medical treatment as before, cont adderall

## 2012-02-11 ENCOUNTER — Emergency Department (HOSPITAL_BASED_OUTPATIENT_CLINIC_OR_DEPARTMENT_OTHER)
Admission: EM | Admit: 2012-02-11 | Discharge: 2012-02-11 | Disposition: A | Discharge status: Home / Self Care | Payer: No Typology Code available for payment source | Attending: Emergency Medicine | Admitting: Emergency Medicine

## 2012-02-11 ENCOUNTER — Emergency Department (HOSPITAL_BASED_OUTPATIENT_CLINIC_OR_DEPARTMENT_OTHER): Payer: No Typology Code available for payment source

## 2012-02-11 ENCOUNTER — Encounter (HOSPITAL_BASED_OUTPATIENT_CLINIC_OR_DEPARTMENT_OTHER): Payer: Self-pay | Admitting: *Deleted

## 2012-02-11 DIAGNOSIS — J45909 Unspecified asthma, uncomplicated: Secondary | ICD-10-CM | POA: Insufficient documentation

## 2012-02-11 DIAGNOSIS — F909 Attention-deficit hyperactivity disorder, unspecified type: Secondary | ICD-10-CM | POA: Insufficient documentation

## 2012-02-11 DIAGNOSIS — E669 Obesity, unspecified: Secondary | ICD-10-CM | POA: Insufficient documentation

## 2012-02-11 DIAGNOSIS — M25519 Pain in unspecified shoulder: Secondary | ICD-10-CM | POA: Insufficient documentation

## 2012-02-11 DIAGNOSIS — M542 Cervicalgia: Secondary | ICD-10-CM | POA: Insufficient documentation

## 2012-02-11 MED ORDER — IBUPROFEN 200 MG PO TABS: 600.0000 mg | ORAL_TABLET | Freq: Three times a day (TID) | ORAL | Status: DC | PRN

## 2012-02-11 MED ORDER — CYCLOBENZAPRINE HCL 5 MG PO TABS: 10.0000 mg | ORAL_TABLET | Freq: Three times a day (TID) | ORAL | Status: AC | PRN
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
## 2012-02-11 NOTE — ED Provider Notes (Signed)
I saw and evaluated the patient, reviewed the resident's note and I agree with the findings and plan.   Rolan Bucco, MD 02/11/12 [phone]

## 2012-02-17 ENCOUNTER — Encounter: Payer: Self-pay | Admitting: Internal Medicine

## 2012-02-17 ENCOUNTER — Ambulatory Visit (INDEPENDENT_AMBULATORY_CARE_PROVIDER_SITE_OTHER): Payer: BC Managed Care – PPO | Admitting: Internal Medicine

## 2012-02-17 VITALS — BP 100/70 | HR 57 | Temp 98.4°F | Ht 70.0 in | Wt 180.1 lb

## 2012-02-17 DIAGNOSIS — F909 Attention-deficit hyperactivity disorder, unspecified type: Secondary | ICD-10-CM

## 2012-02-17 DIAGNOSIS — M549 Dorsalgia, unspecified: Secondary | ICD-10-CM | POA: Insufficient documentation

## 2012-02-17 DIAGNOSIS — F411 Generalized anxiety disorder: Secondary | ICD-10-CM

## 2012-02-17 MED ORDER — IBUPROFEN 200 MG PO TABS: 600.0000 mg | ORAL_TABLET | Freq: Three times a day (TID) | ORAL | Status: AC | PRN

## 2012-02-17 NOTE — Patient Instructions (Addendum)
Continue all other medications as before You are given the return to work note The FMLA form will filled out as well and returned when it arrives

## 2012-02-18 DIAGNOSIS — Z0279 Encounter for issue of other medical certificate: Secondary | ICD-10-CM

## 2012-02-29 ENCOUNTER — Encounter: Payer: Self-pay | Admitting: Internal Medicine

## 2012-02-29 NOTE — Assessment & Plan Note (Signed)
Not currently taking adderall, doing ok socially and work,  to f/u any worsening symptoms or concerns
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
## 2012-02-29 NOTE — Assessment & Plan Note (Signed)
stable overall by hx and exam, most recent data reviewed with pt, and pt to continue medical treatment as before Lab Results  Component Value Date   WBC 7.8 11/19/2011   HGB 15.0 11/19/2011   HCT 43.4 11/19/2011   PLT 249.0 11/19/2011   GLUCOSE 72 11/19/2011   CHOL 152 11/19/2011   TRIG 121.0 11/19/2011   HDL 33.00* 11/19/2011   LDLCALC 95 11/19/2011   ALT 19 11/19/2011   AST 14 11/19/2011   NA 140 11/19/2011   K 4.1 11/19/2011   CL 105 11/19/2011   CREATININE 1.1 11/19/2011   BUN 19 11/19/2011   CO2 29 11/19/2011   TSH 2.69 11/19/2011    
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
## 2012-03-21 ENCOUNTER — Emergency Department (HOSPITAL_BASED_OUTPATIENT_CLINIC_OR_DEPARTMENT_OTHER)
Admission: EM | Admit: 2012-03-21 | Discharge: 2012-03-21 | Disposition: A | Discharge status: Home / Self Care | Payer: BC Managed Care – PPO | Attending: Emergency Medicine | Admitting: Emergency Medicine

## 2012-03-21 ENCOUNTER — Encounter (HOSPITAL_BASED_OUTPATIENT_CLINIC_OR_DEPARTMENT_OTHER): Payer: Self-pay | Admitting: *Deleted

## 2012-03-21 DIAGNOSIS — E669 Obesity, unspecified: Secondary | ICD-10-CM | POA: Insufficient documentation

## 2012-03-21 DIAGNOSIS — J45909 Unspecified asthma, uncomplicated: Secondary | ICD-10-CM | POA: Insufficient documentation

## 2012-03-21 DIAGNOSIS — K589 Irritable bowel syndrome without diarrhea: Secondary | ICD-10-CM | POA: Insufficient documentation

## 2012-03-21 DIAGNOSIS — R1031 Right lower quadrant pain: Secondary | ICD-10-CM | POA: Insufficient documentation

## 2012-03-21 DIAGNOSIS — Z825 Family history of asthma and other chronic lower respiratory diseases: Secondary | ICD-10-CM | POA: Insufficient documentation

## 2012-03-21 DIAGNOSIS — Z888 Allergy status to other drugs, medicaments and biological substances status: Secondary | ICD-10-CM | POA: Insufficient documentation

## 2012-03-21 DIAGNOSIS — R109 Unspecified abdominal pain: Secondary | ICD-10-CM

## 2012-03-21 DIAGNOSIS — F411 Generalized anxiety disorder: Secondary | ICD-10-CM | POA: Insufficient documentation

## 2012-03-21 DIAGNOSIS — F909 Attention-deficit hyperactivity disorder, unspecified type: Secondary | ICD-10-CM | POA: Insufficient documentation

## 2012-03-21 LAB — URINALYSIS, ROUTINE W REFLEX MICROSCOPIC
Ketones, ur: NEGATIVE mg/dL
Leukocytes, UA: NEGATIVE
Nitrite: NEGATIVE
Protein, ur: NEGATIVE mg/dL
pH: 7 (ref 5.0–8.0)

## 2012-03-21 MED ORDER — DICYCLOMINE HCL 10 MG PO CAPS
10.0000 mg | ORAL_CAPSULE | Freq: Once | ORAL | Status: AC
  Administered 2012-03-21: 10 mg via ORAL
  Filled 2012-03-21 (×2): qty 1

## 2012-03-21 MED ORDER — DICYCLOMINE HCL 20 MG PO TABS: 20.0000 mg | ORAL_TABLET | Freq: Two times a day (BID) | ORAL | Status: DC

## 2012-03-21 NOTE — ED Notes (Addendum)
Pt c/o LUQ pain onset last pm. Nausea this a.m. Pain increases when attempting to have BM.

## 2012-03-21 NOTE — ED Provider Notes (Signed)
History     CSN: [id number]  Arrival date & time 03/21/12  4098   First MD Initiated Contact with Patient 03/21/12 1830      Chief Complaint  Patient presents with  . Abdominal Pain    (Consider location/radiation/quality/duration/timing/severity/associated sxs/prior treatment) HPI Comments: Patient reports that he has been having intermittent LLQ abdominal pain for the past 2 days.  Pain typically lasts a few minutes and then resolves.  Pain is a sharp cramping type pain.  Pain does not radiate.  No pain of scrotum.  Pain worse when he has a bowel movement.  His last BM was this morning.  He reports that the BM was somewhat smaller and harder than usual.  No blood in his stool.  Prior to that his last bowel movement was 2 days ago.  He reports that he typically has BM daily.  PMH significant for IBS.  He has Miralax at home, but has not tried to take it for his symptoms.  He denies fever or chills.  Denies nausea, vomiting, or diarrhea.  He has not tried taking anything for his pain prior to arrival in the ED.    The history is provided by the patient.    Past Medical History  Diagnosis Date  . ALLERGIC RHINITIS 03/21/2007  . ALLERGY, HX OF 03/19/2007  . ANXIETY 03/21/2007  . ASTHMA 12/12/2008  . IBS 03/19/2007  . OBESITY 03/19/2007  . SINUSITIS- ACUTE-NOS 12/27/2009  . ADHD (attention deficit hyperactivity disorder) 08/11/2011    Past Surgical History  Procedure Date  . Tonsillectomy   . Stomach surgery     s/p as toddler for malposition    Family History  Problem Relation Age of Onset  . Asthma Other     History  Substance Use Topics  . Smoking status: Never Smoker   . Smokeless tobacco: Not on file  . Alcohol Use: No      Review of Systems  Constitutional: Negative for fever and chills.  Gastrointestinal: Positive for abdominal pain and constipation. Negative for nausea, vomiting, diarrhea, blood in stool, abdominal distention and anal bleeding.  Genitourinary:  Negative for dysuria, urgency, frequency, hematuria, flank pain, decreased urine volume, penile swelling, scrotal swelling, penile pain and testicular pain.  Neurological: Negative for dizziness, syncope and light-headedness.    Allergies  Diphenhydramine hcl  Home Medications   Current Outpatient Rx  Name Route Sig Dispense Refill  . AMPHETAMINE-DEXTROAMPHET ER 20 MG PO CP24 Oral Take 1 capsule (20 mg total) by mouth every morning. To fill January 18, 2012 30 capsule 0    BP 120/65  Pulse 52  Temp 98.1 F (36.7 C) (Oral)  Resp 18  Ht 5\' 10"  (1.778 m)  Wt 178 lb (80.74 kg)  BMI 25.54 kg/m2  SpO2 100%  Physical Exam  Nursing note and vitals reviewed. Constitutional: He appears well-developed and well-nourished. No distress.  HENT:  Head: Normocephalic and atraumatic.  Mouth/Throat: Oropharynx is clear and moist.  Cardiovascular: Normal rate, regular rhythm and normal heart sounds.   Pulmonary/Chest: Effort normal and breath sounds normal.  Abdominal: Soft. Normal appearance and bowel sounds are normal. He exhibits no distension and no mass. There is no splenomegaly. There is tenderness in the left lower quadrant. There is no rigidity, no rebound, no guarding and no tenderness at McBurney's point. Hernia confirmed negative in the right inguinal area and confirmed negative in the left inguinal area.  Genitourinary: Right testis shows no mass, no swelling and no tenderness. Left  testis shows no mass, no swelling and no tenderness.  Neurological: He is alert.  Skin: Skin is warm and dry. No rash noted. He is not diaphoretic. No erythema.  Psychiatric: He has a normal mood and affect.    ED Course  Procedures (including critical care time)   Labs Reviewed  URINALYSIS, ROUTINE W REFLEX MICROSCOPIC   No results found.   No diagnosis found.    MDM  Patient presenting with LLQ abdominal pain.  Feel that symptoms most likely related to IBS.  Patient with no rebound or guarding  on exam.  No RLQ abdominal pain.  Pain has been intermittent over the past 2 days.  Patient afebrile.  No vomiting.  Therefore, feel that surgical abdomen is unlikely.  Patient discharged home with prescription for Bentyl.  Return precautions have been discussed.        Pascal Lux [place], PA-C 03/22/12 0201

## 2012-03-22 NOTE — ED Provider Notes (Signed)
Medical screening examination/treatment/procedure(s) were performed by non-physician practitioner and as supervising physician I was immediately available for consultation/collaboration.   Takoda Janowiak B. Nasim Habeeb, MD 03/22/12 1901 

## 2012-04-01 ENCOUNTER — Other Ambulatory Visit: Payer: Self-pay

## 2012-04-01 DIAGNOSIS — F909 Attention-deficit hyperactivity disorder, unspecified type: Secondary | ICD-10-CM

## 2012-04-01 MED ORDER — AMPHETAMINE-DEXTROAMPHET ER 20 MG PO CP24: 20.0000 mg | ORAL_CAPSULE | ORAL | Status: DC

## 2012-04-01 NOTE — Telephone Encounter (Signed)
Done hardcopy to robin  

## 2012-04-01 NOTE — Telephone Encounter (Signed)
Called the patient informed prescription requested has been filled and hardcopy is ready for pickup at the front desk.

## 2012-04-08 ENCOUNTER — Ambulatory Visit (INDEPENDENT_AMBULATORY_CARE_PROVIDER_SITE_OTHER): Payer: BC Managed Care – PPO | Admitting: Internal Medicine

## 2012-04-08 ENCOUNTER — Encounter: Payer: Self-pay | Admitting: Internal Medicine

## 2012-04-08 VITALS — BP 120/80 | HR 54 | Temp 97.9°F | Resp 14 | Wt 177.3 lb

## 2012-04-08 DIAGNOSIS — J45909 Unspecified asthma, uncomplicated: Secondary | ICD-10-CM

## 2012-04-08 DIAGNOSIS — F411 Generalized anxiety disorder: Secondary | ICD-10-CM

## 2012-04-08 DIAGNOSIS — F909 Attention-deficit hyperactivity disorder, unspecified type: Secondary | ICD-10-CM

## 2012-04-08 DIAGNOSIS — K589 Irritable bowel syndrome without diarrhea: Secondary | ICD-10-CM

## 2012-04-08 MED ORDER — ESCITALOPRAM OXALATE 10 MG PO TABS: 10.0000 mg | ORAL_TABLET | Freq: Every day | ORAL | Status: DC

## 2012-04-08 MED ORDER — ALPRAZOLAM 0.5 MG PO TBDP: 0.5000 mg | ORAL_TABLET | Freq: Two times a day (BID) | ORAL | Status: DC | PRN

## 2012-04-08 MED ORDER — ALPRAZOLAM 0.5 MG PO TABS: 0.5000 mg | ORAL_TABLET | Freq: Two times a day (BID) | ORAL | Status: AC | PRN

## 2012-04-08 NOTE — Assessment & Plan Note (Signed)
Mild to mod, for start lexapro 10 qd, with xanax prn panic only, declines counseling,  to f/u any worsening symptoms or concerns

## 2012-04-08 NOTE — Assessment & Plan Note (Signed)
stable overall by hx and exam, most recent data reviewed with pt, and pt to continue medical treatment as before Lab Results  Component Value Date   WBC 7.8 11/19/2011   HGB 15.0 11/19/2011   HCT 43.4 11/19/2011   PLT 249.0 11/19/2011   GLUCOSE 72 11/19/2011   CHOL 152 11/19/2011   TRIG 121.0 11/19/2011   HDL 33.00* 11/19/2011   LDLCALC 95 11/19/2011   ALT 19 11/19/2011   AST 14 11/19/2011   NA 140 11/19/2011   K 4.1 11/19/2011   CL 105 11/19/2011   CREATININE 1.1 11/19/2011   BUN 19 11/19/2011   CO2 29 11/19/2011   TSH 2.69 11/19/2011    

## 2012-04-08 NOTE — Assessment & Plan Note (Signed)
stable overall by hx and exam, most recent data reviewed with pt, and pt to continue medical treatment as before Lab Results  Component Value Date   WBC 7.8 11/19/2011   HGB 15.0 11/19/2011   HCT 43.4 11/19/2011   PLT 249.0 11/19/2011   GLUCOSE 72 11/19/2011   CHOL 152 11/19/2011   TRIG 121.0 11/19/2011   HDL 33.00* 11/19/2011   LDLCALC 95 11/19/2011   ALT 19 11/19/2011   AST 14 11/19/2011   NA 140 11/19/2011   K 4.1 11/19/2011   CL 105 11/19/2011   CREATININE 1.1 11/19/2011   BUN 19 11/19/2011   CO2 29 11/19/2011   TSH 2.69 11/19/2011

## 2012-04-08 NOTE — Patient Instructions (Addendum)
Take all new medications as prescribed Continue all other medications as before Please call in 3-4 weeks if you feel the lexapro should be increased to 20 mg, but watch for any unusual fatigue

## 2012-04-08 NOTE — Assessment & Plan Note (Signed)
stable overall by hx and exam, most recent data reviewed with pt, and pt to continue medical treatment as before SpO2 Readings from Last 3 Encounters:  04/08/12 95%  03/21/12 100%  02/17/12 97%

## 2012-04-08 NOTE — Progress Notes (Signed)
  Subjective:    Patient ID: Maxwell Herrera, male    DOB: 1986/07/13, 26 y.o.   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  Seen in ER recently for abd pain , but Denies worsening reflux, dysphagia, abd pain, n/v, bowel change or blood.  Currently no pain.   Pt denies fever, wt loss, night sweats, loss of appetite, or other constitutional symptoms  Pt denies chest pain, increased sob or doe, wheezing, orthopnea, PND, increased LE swelling, palpitations, dizziness or syncope.   Pt denies polydipsia, polyuria. Past Medical History  Diagnosis Date  . ALLERGIC RHINITIS 03/21/2007  . ALLERGY, HX OF 03/19/2007  . ANXIETY 03/21/2007  . ASTHMA 12/12/2008  . IBS 03/19/2007  . OBESITY 03/19/2007  . SINUSITIS- ACUTE-NOS 12/27/2009  . ADHD (attention deficit hyperactivity disorder) 08/11/2011   Past Surgical History  Procedure Date  . Tonsillectomy   . Stomach surgery     s/p as toddler for malposition    reports that he has never smoked. He does not have any smokeless tobacco history on file. He reports that he does not drink alcohol or use illicit drugs. family history includes Asthma in his other. Allergies  Allergen Reactions  . Diphenhydramine Hcl    Current Outpatient Prescriptions on File Prior to Visit  Medication Sig Dispense Refill  . amphetamine-dextroamphetamine (ADDERALL XR) 20 MG 24 hr capsule Take 1 capsule (20 mg total) by mouth every morning.  30 capsule  0  . dicyclomine (BENTYL) 20 MG tablet Take 1 tablet (20 mg total) by mouth 2 (two) times daily.  20 tablet  0  . escitalopram (LEXAPRO) 10 MG tablet Take 1 tablet (10 mg total) by mouth daily.  90 tablet  3   Review of  Systems Constitutional: Negative for diaphoresis and unexpected weight change.    Eyes: Negative for photophobia and visual disturbance.  Respiratory: Negative for choking and stridor.   Gastrointestinal: Negative for vomiting and blood in stool.  Genitourinary: Negative for hematuria and decreased urine volume.  Musculoskeletal: Negative for gait problem.  Neurological: Negative for tremors and numbness.  Psychiatric/Behavioral: Negative for decreased concentration. The patient is not hyperactive.      Objective:   Physical Exam BP 120/80  Pulse 54  Temp 97.9 F (36.6 C) (Oral)  Resp 14  Wt 177 lb 5 oz (80.428 kg)  SpO2 95% Physical Exam  VS noted Constitutional: Pt appears well-developed and well-nourished.  HENT: Head: Normocephalic.  Right Ear: External ear normal.  Left Ear: External ear normal.  Eyes: Conjunctivae and EOM are normal. Pupils are equal, round, and reactive to light.  Neck: Normal range of motion. Neck supple.  Cardiovascular: Normal rate and regular rhythm.   Pulmonary/Chest: Effort normal and breath sounds normal.  Abd:  Soft, NT, non-distended, + BS Neurological: Pt is alert. Not confused Psychiatric: Pt behavior is normal. Thought content normal. 1-2+ nervous    Assessment & Plan:

## 2012-05-24 ENCOUNTER — Other Ambulatory Visit: Payer: Self-pay

## 2012-05-24 DIAGNOSIS — F909 Attention-deficit hyperactivity disorder, unspecified type: Secondary | ICD-10-CM

## 2012-05-24 MED ORDER — AMPHETAMINE-DEXTROAMPHET ER 20 MG PO CP24: 20.0000 mg | ORAL_CAPSULE | ORAL | Status: DC

## 2012-05-24 NOTE — Telephone Encounter (Signed)
Called the patient informed prescription requested is ready for pickup at the front desk. 

## 2012-05-24 NOTE — Telephone Encounter (Signed)
Done hardcopy to robin  

## 2012-07-09 ENCOUNTER — Ambulatory Visit (INDEPENDENT_AMBULATORY_CARE_PROVIDER_SITE_OTHER): Payer: BC Managed Care – PPO | Admitting: Internal Medicine

## 2012-07-09 ENCOUNTER — Encounter: Payer: Self-pay | Admitting: Internal Medicine

## 2012-07-09 VITALS — BP 112/82 | HR 68 | Temp 98.2°F | Ht 70.0 in | Wt 177.4 lb

## 2012-07-09 DIAGNOSIS — F909 Attention-deficit hyperactivity disorder, unspecified type: Secondary | ICD-10-CM

## 2012-07-09 DIAGNOSIS — B349 Viral infection, unspecified: Secondary | ICD-10-CM

## 2012-07-09 DIAGNOSIS — J45909 Unspecified asthma, uncomplicated: Secondary | ICD-10-CM

## 2012-07-09 DIAGNOSIS — B9789 Other viral agents as the cause of diseases classified elsewhere: Secondary | ICD-10-CM

## 2012-07-09 MED ORDER — ONDANSETRON HCL 4 MG PO TABS: ORAL_TABLET | ORAL | Status: DC

## 2012-07-09 MED ORDER — AMPHETAMINE-DEXTROAMPHET ER 20 MG PO CP24: 20.0000 mg | ORAL_CAPSULE | ORAL | Status: DC

## 2012-07-09 MED ORDER — AMPHETAMINE-DEXTROAMPHET ER 20 MG PO CP24
20.0000 mg | ORAL_CAPSULE | ORAL | Status: DC
Start: 2012-07-09 — End: 2012-07-09

## 2012-07-09 MED ORDER — DIPHENOXYLATE-ATROPINE 2.5-0.025 MG PO TABS: 1.0000 | ORAL_TABLET | Freq: Four times a day (QID) | ORAL | Status: DC | PRN

## 2012-07-09 NOTE — Patient Instructions (Addendum)
Take all new medications as prescribed - the nausea and diarrhea medications Continue all other medications as before, including the adderall refills today Please try to drink small amounts of fluid constantly over the next week to avoid dehydration Please return for any worsening fever, pain, vomiting, or blood, or other symptoms

## 2012-07-10 ENCOUNTER — Encounter: Payer: Self-pay | Admitting: Internal Medicine

## 2012-07-10 DIAGNOSIS — B349 Viral infection, unspecified: Secondary | ICD-10-CM | POA: Insufficient documentation

## 2012-07-10 NOTE — Assessment & Plan Note (Signed)
stable overall by hx and exam, most recent data reviewed with pt, and pt to continue medical treatment as before, for med refills 

## 2012-07-10 NOTE — Assessment & Plan Note (Signed)
stable overall by hx and exam, most recent data reviewed with pt, and pt to continue medical treatment as before SpO2 Readings from Last 3 Encounters:  07/09/12 97%  04/08/12 95%  03/21/12 100%

## 2012-07-10 NOTE — Progress Notes (Signed)
  Subjective:    Patient ID: Maxwell Herrera, male    DOB: 24-Jul-1986, 24 y.o.   MRN: [id number]  HPI  Here with 2 day onset feeling ill with low grade fever, slight ST, marked nausea with vomit x 1 last night only, intermittent crampy abd pains and mult loose stools, sometime watery, without blood, recent wt loss, chills, cough.  Pt denies chest pain, increased sob or doe, wheezing, orthopnea, PND, increased LE swelling, palpitations, dizziness or syncope.   Pt denies polydipsia, polyuria.  Pt denies new neurological symptoms such as new headache, or facial or extremity weakness or numbness.  Asks for adderall refills, overall doing well with good function at work/social, able to concentrate, better task completion ability. Past Medical History  Diagnosis Date  . ALLERGIC RHINITIS 03/21/2007  . ALLERGY, HX OF 03/19/2007  . ANXIETY 03/21/2007  . ASTHMA 12/12/2008  . IBS 03/19/2007  . OBESITY 03/19/2007  . SINUSITIS- ACUTE-NOS 12/27/2009  . ADHD (attention deficit hyperactivity disorder) 08/11/2011   Past Surgical History  Procedure Date  . Tonsillectomy   . Stomach surgery     s/p as toddler for malposition    reports that he has never smoked. He does not have any smokeless tobacco history on file. He reports that he does not drink alcohol or use illicit drugs. family history includes Asthma in his other. Allergies  Allergen Reactions  . Diphenhydramine Hcl    Current Outpatient Prescriptions on File Prior to Visit  Medication Sig Dispense Refill  . dicyclomine (BENTYL) 20 MG tablet Take 1 tablet (20 mg total) by mouth 2 (two) times daily.  20 tablet  0  . amphetamine-dextroamphetamine (ADDERALL XR) 20 MG 24 hr capsule Take 1 capsule (20 mg total) by mouth every morning. To fill Sep 07, 2012  30 capsule  0  . escitalopram (LEXAPRO) 10 MG tablet Take 1 tablet (10 mg total) by mouth daily.  90 tablet  3   Review of Systems  Constitutional: Negative for diaphoresis and unexpected weight change.   HENT: Negative for tinnitus.   Eyes: Negative for photophobia and visual disturbance.  Respiratory: Negative for choking and stridor.   Gastrointestinal: Negative for vomiting and blood in stool.  Genitourinary: Negative for hematuria and decreased urine volume.  Musculoskeletal: Negative for gait problem.  Skin: Negative for color change and wound.  Neurological: Negative for tremors and numbness.  Psychiatric/Behavioral: Negative for decreased concentration. The patient is not hyperactive.       Objective:   Physical Exam BP 112/82  Pulse 68  Temp 98.2 F (36.8 C) (Oral)  Ht 5\' 10"  (1.778 m)  Wt 177 lb 6 oz (80.457 kg)  BMI 25.45 kg/m2  SpO2 97% Physical Exam  VS noted, mild ill Constitutional: Pt appears well-developed and well-nourished.  HENT: Head: Normocephalic.  Right Ear: External ear normal.  Left Ear: External ear normal.  Bilat tm's mild erythema.  Sinus nontender.  Pharynx mild erythema Eyes: Conjunctivae and EOM are normal. Pupils are equal, round, and reactive to light.  Neck: Normal range of motion. Neck supple.  Cardiovascular: Normal rate and regular rhythm.   Pulmonary/Chest: Effort normal and breath sounds normal.  Abd:  Soft, non-distended, + BS with diffuse mild tender some worse LLQ without guarding or rebound Neurological: Pt is alert. Not confused  Skin: Skin is warm. No erythema.  Psychiatric: Pt behavior is normal. Thought content normal.     Assessment & Plan:

## 2012-07-10 NOTE — Assessment & Plan Note (Signed)
C/w acute gastroenteritis, likely viral, for antiemetic/lomotil, push fluids, tylenol prn, . to f/u any worsening symptoms or concerns

## 2012-07-12 ENCOUNTER — Telehealth: Payer: Self-pay

## 2012-07-12 NOTE — Telephone Encounter (Signed)
Completed note called the patient informed to pickup at convenience.

## 2012-07-12 NOTE — Telephone Encounter (Signed)
Ok for note 

## 2012-07-12 NOTE — Telephone Encounter (Signed)
The patient was seen on Friday 07/09/12 and was to get a work note to excuse from work Friday (07/09/12) through today (07/12/12), please advise

## 2012-09-08 ENCOUNTER — Telehealth: Payer: Self-pay | Admitting: Internal Medicine

## 2012-09-08 DIAGNOSIS — F909 Attention-deficit hyperactivity disorder, unspecified type: Secondary | ICD-10-CM

## 2012-09-08 NOTE — Telephone Encounter (Signed)
Patient is requesting a refill on his adderall, call when ready

## 2012-09-09 NOTE — Telephone Encounter (Signed)
Called [phone] for approval of medication.  Received approval from 08/10/12 through 09/09/13 [id number].  Patient will be contacted as well as pharmacy.

## 2012-09-09 NOTE — Telephone Encounter (Signed)
Last refill already done on feb 4

## 2012-09-09 NOTE — Telephone Encounter (Signed)
Informed the patient and I have PA to do for this patient.  Will get to asap.

## 2012-10-26 ENCOUNTER — Encounter: Payer: Self-pay | Admitting: Internal Medicine

## 2012-10-26 ENCOUNTER — Ambulatory Visit (INDEPENDENT_AMBULATORY_CARE_PROVIDER_SITE_OTHER): Payer: BC Managed Care – PPO | Admitting: Internal Medicine

## 2012-10-26 VITALS — BP 102/70 | HR 53 | Temp 97.8°F | Ht 71.0 in | Wt 185.1 lb

## 2012-10-26 DIAGNOSIS — J209 Acute bronchitis, unspecified: Secondary | ICD-10-CM | POA: Insufficient documentation

## 2012-10-26 DIAGNOSIS — F909 Attention-deficit hyperactivity disorder, unspecified type: Secondary | ICD-10-CM

## 2012-10-26 DIAGNOSIS — F411 Generalized anxiety disorder: Secondary | ICD-10-CM

## 2012-10-26 DIAGNOSIS — Z0000 Encounter for general adult medical examination without abnormal findings: Secondary | ICD-10-CM

## 2012-10-26 MED ORDER — HYDROCODONE-HOMATROPINE 5-1.5 MG/5ML PO SYRP: 5.0000 mL | ORAL_SOLUTION | Freq: Four times a day (QID) | ORAL | Status: DC | PRN

## 2012-10-26 MED ORDER — AMPHETAMINE-DEXTROAMPHET ER 20 MG PO CP24: 20.0000 mg | ORAL_CAPSULE | ORAL | Status: DC

## 2012-10-26 MED ORDER — AZITHROMYCIN 250 MG PO TABS: ORAL_TABLET | ORAL | Status: DC

## 2012-10-26 NOTE — Patient Instructions (Addendum)
Please take all new medication as prescribed - the antibiotic, cough med Please continue all other medications as before, and refills have been done if requested - the adderall Thank you for enrolling in MyChart. Please follow the instructions below to securely access your online medical record. MyChart allows you to send messages to your doctor, view your test results, renew your prescriptions, schedule appointments, and more. To Log into My Chart online, please go by [employer] or [employer] to [email or web address], or download the MyChart App from the [employer] of [employer] .  Your Username is: [email or web address] (password - your phone number) Please return in 3 months, or sooner if needed, with Lab testing done 3-5 days before

## 2012-10-26 NOTE — Assessment & Plan Note (Signed)
Mild to mod, for antibx course,  to f/u any worsening symptoms or concerns; gave work note as well

## 2012-10-26 NOTE — Assessment & Plan Note (Signed)
stable overall by history and exam, recent data reviewed with pt, and pt to continue medical treatment as before,  to f/u any worsening symptoms or concerns Lab Results  Component Value Date   WBC 7.8 11/19/2011   HGB 15.0 11/19/2011   HCT 43.4 11/19/2011   PLT 249.0 11/19/2011   GLUCOSE 72 11/19/2011   CHOL 152 11/19/2011   TRIG 121.0 11/19/2011   HDL 33.00* 11/19/2011   LDLCALC 95 11/19/2011   ALT 19 11/19/2011   AST 14 11/19/2011   NA 140 11/19/2011   K 4.1 11/19/2011   CL 105 11/19/2011   CREATININE 1.1 11/19/2011   BUN 19 11/19/2011   CO2 29 11/19/2011   TSH 2.69 11/19/2011   For med refills

## 2012-10-26 NOTE — Progress Notes (Signed)
  Subjective:    Patient ID: Maxwell Herrera, male    DOB: 1985-11-23, 27 y.o.   MRN: [id number]  HPI  Here with acute onset mild to mod 2-3 days ST, HA, general weakness and malaise, with prod cough greenish sputum, but Pt denies chest pain, increased sob or doe, wheezing, orthopnea, PND, increased LE swelling, palpitations, dizziness or syncope.  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 Denies worsening depressive symptoms, suicidal ideation, or panic; has ongoing anxiety, not increased recently.  Past Medical History  Diagnosis Date  . ALLERGIC RHINITIS 03/21/2007  . ALLERGY, HX OF 03/19/2007  . ANXIETY 03/21/2007  . ASTHMA 12/12/2008  . IBS 03/19/2007  . OBESITY 03/19/2007  . SINUSITIS- ACUTE-NOS 12/27/2009  . ADHD (attention deficit hyperactivity disorder) 08/11/2011   Past Surgical History  Procedure Laterality Date  . Tonsillectomy    . Stomach surgery      s/p as toddler for malposition    reports that he has never smoked. He does not have any smokeless tobacco history on file. He reports that he does not drink alcohol or use illicit drugs. family history includes Asthma in his other. Allergies  Allergen Reactions  . Diphenhydramine Hcl    Current Outpatient Prescriptions on File Prior to Visit  Medication Sig Dispense Refill  . escitalopram (LEXAPRO) 10 MG tablet Take 1 tablet (10 mg total) by mouth daily.  90 tablet  3   No current facility-administered medications on file prior to visit.   Review of Systems  Constitutional: Negative for unexpected weight change, or unusual diaphoresis  HENT: Negative for tinnitus.   Eyes: Negative for photophobia and visual disturbance.  Respiratory: Negative for choking and stridor.   Gastrointestinal: Negative for vomiting and blood in stool.  Genitourinary: Negative for hematuria and decreased urine volume.  Musculoskeletal: Negative for acute joint  swelling Skin: Negative for color change and wound.  Neurological: Negative for tremors and numbness other than noted  Psychiatric/Behavioral: Negative for decreased concentration or  hyperactivity.       Objective:   Physical Exam BP 102/70  Pulse 53  Temp(Src) 97.8 F (36.6 C) (Oral)  Ht 5\' 11"  (1.803 m)  Wt 185 lb 2 oz (83.972 kg)  BMI 25.83 kg/m2  SpO2 97% VS noted, mild ill appearing Constitutional: Pt appears well-developed and well-nourished.  HENT: Head: NCAT.  Right Ear: External ear normal.  Left Ear: External ear normal.  Eyes: Conjunctivae and EOM are normal. Pupils are equal, round, and reactive to light.  Neck: Normal range of motion. Neck supple.  Bilat tm's with mild erythema.  Max sinus areas mild tender.  Pharynx with mild erythema, no exudate Cardiovascular: Normal rate and regular rhythm.   Pulmonary/Chest: Effort normal and breath sounds normal.  Neurological: Pt is alert. Not confused  Skin: Skin is warm. No erythema.  Psychiatric: Pt behavior is normal. Thought content normal. not nervous or depressed affect    Assessment & Plan:

## 2012-10-26 NOTE — Assessment & Plan Note (Signed)
Stable on current lexapro without sluggishness, to cont tx as is

## 2012-10-29 ENCOUNTER — Encounter: Payer: Self-pay | Admitting: Internal Medicine

## 2012-11-04 ENCOUNTER — Ambulatory Visit: Payer: BC Managed Care – PPO | Admitting: Internal Medicine

## 2012-11-30 ENCOUNTER — Encounter: Payer: Self-pay | Admitting: *Deleted

## 2012-11-30 ENCOUNTER — Ambulatory Visit (INDEPENDENT_AMBULATORY_CARE_PROVIDER_SITE_OTHER): Payer: BC Managed Care – PPO | Admitting: Internal Medicine

## 2012-11-30 ENCOUNTER — Encounter: Payer: Self-pay | Admitting: Internal Medicine

## 2012-11-30 VITALS — BP 110/72 | HR 59 | Temp 98.1°F | Ht 71.0 in | Wt 186.0 lb

## 2012-11-30 DIAGNOSIS — R6889 Other general symptoms and signs: Secondary | ICD-10-CM

## 2012-11-30 DIAGNOSIS — J111 Influenza due to unidentified influenza virus with other respiratory manifestations: Secondary | ICD-10-CM

## 2012-11-30 DIAGNOSIS — J1189 Influenza due to unidentified influenza virus with other manifestations: Secondary | ICD-10-CM

## 2012-11-30 DIAGNOSIS — J112 Influenza due to unidentified influenza virus with gastrointestinal manifestations: Secondary | ICD-10-CM

## 2012-11-30 NOTE — Progress Notes (Signed)
Subjective:    Patient ID: Maxwell Herrera, male    DOB: March 29, 1986, 27 y.o.   MRN: [id number]  HPI  Pt presents to the clinic today with c/o nausea, diarrhea, fever, body aches and chest congestion. This all started Friday morning. They symptoms seem to have gotten worse over the weekend but have improved somewhat since Monday. The worse part now is the fatigue. He has been taking Dayquil and Nyquil. He is not producing any sputum. He has had contact with a family member who has been diagnosed with flu. He does have a history of allergies and asthma. He did not get his flu shot this year.  Review of Systems  Past Medical History  Diagnosis Date  . ALLERGIC RHINITIS 03/21/2007  . ALLERGY, HX OF 03/19/2007  . ANXIETY 03/21/2007  . ASTHMA 12/12/2008  . IBS 03/19/2007  . OBESITY 03/19/2007  . SINUSITIS- ACUTE-NOS 12/27/2009  . ADHD (attention deficit hyperactivity disorder) 08/11/2011    Current Outpatient Prescriptions  Medication Sig Dispense Refill  . amphetamine-dextroamphetamine (ADDERALL XR) 20 MG 24 hr capsule Take 1 capsule (20 mg total) by mouth every morning. To fill Dec 25, 2012  30 capsule  0  . escitalopram (LEXAPRO) 10 MG tablet Take 1 tablet (10 mg total) by mouth daily.  90 tablet  3   No current facility-administered medications for this visit.    Allergies  Allergen Reactions  . Diphenhydramine Hcl     Family History  Problem Relation Age of Onset  . Asthma Other     History   Social History  . Marital Status: Single    Spouse Name: N/A    Number of Children: N/A  . Years of Education: N/A   Occupational History  . College Student    Social History Main Topics  . Smoking status: Never Smoker   . Smokeless tobacco: Not on file  . Alcohol Use: No  . Drug Use: No  . Sexually Active: Not on file   Other Topics Concern  . Not on file   Social History Narrative  . No narrative on file     Constitutional: Pt reports fatigue, fever and headache. Denies  abrupt weight changes.  HEENT: Denies eye pain, eye redness, ear pain, ringing in the ears, wax buildup, runny nose, nasal congestion, bloody nose, or sore throat. Respiratory: Pt reports chest congestion. Denies difficulty breathing, shortness of breath, cough or sputum production.    Gastrointestinal: Pt reports nausea and diarrhea. Denies abdominal pain, bloating, constipation,  or blood in the stool. . Musculoskeletal: Pt reports generalized body aches. Denies decrease in range of motion, difficulty with gait, muscle pain or joint pain and swelling.   No other specific complaints in a complete review of systems (except as listed in HPI above).  Objective:   Physical Exam  BP 110/72  Pulse 59  Temp(Src) 98.1 F (36.7 C) (Oral)  Ht 5\' 11"  (1.803 m)  Wt 186 lb (84.369 kg)  BMI 25.95 kg/m2  SpO2 97% Wt Readings from Last 3 Encounters:  11/30/12 186 lb (84.369 kg)  10/26/12 185 lb 2 oz (83.972 kg)  07/09/12 177 lb 6 oz (80.457 kg)    General: Appears his stated age, well developed, well nourished in NAD. HEENT: Head: normal shape and size; Eyes: sclera white, no icterus, conjunctiva pink, PERRLA and EOMs intact; Ears: Tm's gray and intact, normal light reflex; Nose: mucosa pink and moist, septum midline; Throat/Mouth: Teeth present, mucosa pink and moist, no exudate, lesions  or ulcerations noted.  Neck: Normal range of motion. Neck supple, trachea midline. No massses, lumps or thyromegaly present.  Cardiovascular: Normal rate and rhythm. S1,S2 noted.  No murmur, rubs or gallops noted. No JVD or BLE edema. No carotid bruits noted. Pulmonary/Chest: Normal effort and positive vesicular breath sounds. No respiratory distress. No wheezes, rales or ronchi noted.  Abdomen: Soft and nontender. Normal bowel sounds, no bruits noted. No distention or masses noted. Liver, spleen and kidneys non palpable. Musculoskeletal: Normal range of motion. No signs of joint swelling. No difficulty with gait.   .        Assessment & Plan:   Flu like symptoms, with known contact with influenza virus, new onset:  Given duration of symptoms, will treat symptomatically Tylenol for fever Rest and stay well hydrated Work note provided Wash hand thoroughly with  Soap and water  RTC as needed or if symptoms persist

## 2012-11-30 NOTE — Patient Instructions (Signed)
Influenza Facts  Flu (influenza) is a contagious respiratory illness caused by the influenza viruses. It can cause mild to severe illness. While most healthy people recover from the flu without specific treatment and without complications, older people, young children, and people with certain health conditions are at higher risk for serious complications from the flu, including death.  CAUSES    The flu virus is spread from person to person by respiratory droplets from coughing and sneezing.   A person can also become infected by touching an object or surface with a virus on it and then touching their mouth, eye or nose.   Adults may be able to infect others from 1 day before symptoms occur and up to 7 days after getting sick. So it is possible to give someone the flu even before you know you are sick and continue to infect others while you are sick.  SYMPTOMS    Fever (usually high).   Headache.   Tiredness (can be extreme).   Cough.   Sore throat.   Runny or stuffy nose.   Body aches.   Diarrhea and vomiting may also occur, particularly in children.   These symptoms are referred to as "flu-like symptoms". A lot of different illnesses, including the common cold, can have similar symptoms.  DIAGNOSIS    There are tests that can determine if you have the flu as long you are tested within the first 2 or 3 days of illness.   A doctor's exam and additional tests may be needed to identify if you have a disease that is a complicating the flu.  RISKS AND COMPLICATIONS   Some of the complications caused by the flu include:   Bacterial pneumonia or progressive pneumonia caused by the flu virus.   Loss of body fluids (dehydration).   Worsening of chronic medical conditions, such as heart failure, asthma, or diabetes.   Sinus problems and ear infections.  HOME CARE INSTRUCTIONS    Seek medical care early on.   If you are at high risk from complications of the flu, consult your health-care provider as soon  as you develop flu-like symptoms. Those at high risk for complications include:   People 65 years or older.   People with chronic medical conditions, including diabetes.   Pregnant women.   Young children.   Your caregiver may recommend use of an antiviral medication to help treat the flu.   If you get the flu, get plenty of rest, drink a lot of liquids, and avoid using alcohol and tobacco.   You can take over-the-counter medications to relieve the symptoms of the flu if your caregiver approves. (Never give aspirin to children or teenagers who have flu-like symptoms, particularly fever).  PREVENTION   The single best way to prevent the flu is to get a flu vaccine each fall. Other measures that can help protect against the flu are:   Antiviral Medications   A number of antiviral drugs are approved for use in preventing the flu. These are prescription medications, and a doctor should be consulted before they are used.   Habits for Good Health   Cover your nose and mouth with a tissue when you cough or sneeze, throw the tissue away after you use it.   Wash your hands often with soap and water, especially after you cough or sneeze. If you are not near water, use an alcohol-based hand cleaner.   Avoid people who are sick.   If you get the   flu, stay home from work or school. Avoid contact with other people so that you do not make them sick, too.   Try not to touch your eyes, nose, or mouth as germs ore often spread this way.  IN CHILDREN, EMERGENCY WARNING SIGNS THAT NEED URGENT MEDICAL ATTENTION:   Fast breathing or trouble breathing.   Bluish skin color.   Not drinking enough fluids.   Not waking up or not interacting.   Being so irritable that the child does not want to be held.   Flu-like symptoms improve but then return with fever and worse cough.   Fever with a rash.  IN ADULTS, EMERGENCY WARNING SIGNS THAT NEED URGENT MEDICAL ATTENTION:   Difficulty breathing or shortness of breath.   Pain  or pressure in the chest or abdomen.   Sudden dizziness.   Confusion.   Severe or persistent vomiting.  SEEK IMMEDIATE MEDICAL CARE IF:   You or someone you know is experiencing any of the symptoms above. When you arrive at the emergency center,report that you think you have the flu. You may be asked to wear a mask and/or sit in a secluded area to protect others from getting sick.  MAKE SURE YOU:    Understand these instructions.   Monitor your condition.   Seek medical care if you are getting worse, or not improving.  Document Released: 07/24/2003 Document Revised: 10/13/2011 Document Reviewed: 04/19/2009  [employer] Patient Information 2013 [employer].

## 2013-01-17 ENCOUNTER — Encounter: Payer: Self-pay | Admitting: Internal Medicine

## 2013-01-18 ENCOUNTER — Telehealth: Payer: Self-pay

## 2013-01-18 NOTE — Telephone Encounter (Signed)
Ok for work note - to robin to handle

## 2013-01-18 NOTE — Telephone Encounter (Signed)
Work note complete.

## 2013-01-26 ENCOUNTER — Encounter: Payer: Self-pay | Admitting: Internal Medicine

## 2013-01-26 ENCOUNTER — Ambulatory Visit (INDEPENDENT_AMBULATORY_CARE_PROVIDER_SITE_OTHER): Payer: BC Managed Care – PPO | Admitting: Internal Medicine

## 2013-01-26 VITALS — BP 100/68 | HR 59 | Temp 98.3°F | Ht 70.0 in | Wt 179.4 lb

## 2013-01-26 DIAGNOSIS — F909 Attention-deficit hyperactivity disorder, unspecified type: Secondary | ICD-10-CM

## 2013-01-26 DIAGNOSIS — F411 Generalized anxiety disorder: Secondary | ICD-10-CM

## 2013-01-26 MED ORDER — ESCITALOPRAM OXALATE 10 MG PO TABS: 10.0000 mg | ORAL_TABLET | Freq: Every day | ORAL | Status: DC

## 2013-01-26 MED ORDER — LORAZEPAM 1 MG PO TABS: 1.0000 mg | ORAL_TABLET | Freq: Every day | ORAL | Status: DC | PRN

## 2013-01-26 NOTE — Progress Notes (Signed)
  Subjective:    Patient ID: Maxwell Herrera, male    DOB: 1985/12/02, 27 y.o.   MRN: [id number]  HPI  Here to f/u after events last wk with mult severe stressors all occuring at one time led to severe panic attack, though did not have to go to ER.  Has few attacks but several per yr.  Overall good compliance with treatment, and good medicine tolerability, including ADD med and lexapro.  Pt denies chest pain, increased sob or doe, wheezing, orthopnea, PND, increased LE swelling, palpitations, dizziness or syncope.  Pt denies new neurological symptoms such as new headache, or facial or extremity weakness or numbness   Pt denies polydipsia, polyuria.  Past Medical History  Diagnosis Date  . ALLERGIC RHINITIS 03/21/2007  . ALLERGY, HX OF 03/19/2007  . ANXIETY 03/21/2007  . ASTHMA 12/12/2008  . IBS 03/19/2007  . OBESITY 03/19/2007  . SINUSITIS- ACUTE-NOS 12/27/2009  . ADHD (attention deficit hyperactivity disorder) 08/11/2011   Past Surgical History  Procedure Laterality Date  . Tonsillectomy    . Stomach surgery      s/p as toddler for malposition    reports that he has never smoked. He does not have any smokeless tobacco history on file. He reports that he does not drink alcohol or use illicit drugs. family history includes Asthma in his other. Allergies  Allergen Reactions  . Diphenhydramine Hcl    Current Outpatient Prescriptions on File Prior to Visit  Medication Sig Dispense Refill  . amphetamine-dextroamphetamine (ADDERALL XR) 20 MG 24 hr capsule Take 1 capsule (20 mg total) by mouth every morning. To fill Dec 25, 2012  30 capsule  0  . escitalopram (LEXAPRO) 10 MG tablet Take 1 tablet (10 mg total) by mouth daily.  90 tablet  3   No current facility-administered medications on file prior to visit.   Review of Systems All otherwise neg per pt     Objective:   Physical Exam BP 100/68  Pulse 59  Temp(Src) 98.3 F (36.8 C) (Oral)  Ht 5\' 10"  (1.778 m)  Wt 179 lb 6 oz (81.364 kg)  BMI  25.74 kg/m2  SpO2 96% VS noted,  Constitutional: Pt appears well-developed and well-nourished.  HENT: Head: NCAT.  Right Ear: External ear normal.  Left Ear: External ear normal.  Eyes: Conjunctivae and EOM are normal. Pupils are equal, round, and reactive to light.  Neck: Normal range of motion. Neck supple.  Cardiovascular: Normal rate and regular rhythm.   Pulmonary/Chest: Effort normal and breath sounds normal.  Abd:  Soft, NT, non-distended, + BS Neurological: Pt is alert. Not confused  Skin: Skin is warm. No erythema.  Psychiatric: Pt behavior is normal. Thought content normal. mild nervous    Assessment & Plan:

## 2013-01-26 NOTE — Patient Instructions (Signed)
Please take all new medication as prescribed - the ativan ONLY if needed  Please continue all other medications as before  Please continue your efforts at being more active, low cholesterol diet, and weight control.  Please return in 1 year for your yearly visit, or sooner if needed

## 2013-01-26 NOTE — Assessment & Plan Note (Signed)
To cont lexapro, and ativan prn panic

## 2013-01-26 NOTE — Assessment & Plan Note (Signed)
stable overall by history and exam, and pt to continue medical treatment as before,  to f/u any worsening symptoms or concerns 

## 2013-02-21 ENCOUNTER — Ambulatory Visit (INDEPENDENT_AMBULATORY_CARE_PROVIDER_SITE_OTHER): Payer: BC Managed Care – PPO | Admitting: Internal Medicine

## 2013-02-21 ENCOUNTER — Encounter: Payer: Self-pay | Admitting: Internal Medicine

## 2013-02-21 VITALS — BP 112/70 | HR 49 | Temp 97.0°F | Ht 70.0 in | Wt 185.1 lb

## 2013-02-21 DIAGNOSIS — F411 Generalized anxiety disorder: Secondary | ICD-10-CM

## 2013-02-21 MED ORDER — ESCITALOPRAM OXALATE 20 MG PO TABS: 20.0000 mg | ORAL_TABLET | Freq: Every day | ORAL | Status: DC

## 2013-02-21 NOTE — Progress Notes (Signed)
  Subjective:    Patient ID: Maxwell Herrera, male    DOB: 1986-02-15, 27 y.o.   MRN: [id number]  HPI  Here to f/u, has been somewhat improved in the past mo on lexapro 10 but recently with increased stressors with mother and grandmother in hospital at the same time.  Had 2 panic attacks last wk, both helped adequately with ativan, but wants to avoid using this if possible.  Pt denies chest pain, increased sob or doe, wheezing, orthopnea, PND, increased LE swelling, palpitations, dizziness or syncope. Past Medical History  Diagnosis Date  . ALLERGIC RHINITIS 03/21/2007  . ALLERGY, HX OF 03/19/2007  . ANXIETY 03/21/2007  . ASTHMA 12/12/2008  . IBS 03/19/2007  . OBESITY 03/19/2007  . SINUSITIS- ACUTE-NOS 12/27/2009  . ADHD (attention deficit hyperactivity disorder) 08/11/2011   Past Surgical History  Procedure Laterality Date  . Tonsillectomy    . Stomach surgery      s/p as toddler for malposition    reports that he has never smoked. He does not have any smokeless tobacco history on file. He reports that he does not drink alcohol or use illicit drugs. family history includes Asthma in his other. Allergies  Allergen Reactions  . Diphenhydramine Hcl    Current Outpatient Prescriptions on File Prior to Visit  Medication Sig Dispense Refill  . LORazepam (ATIVAN) 1 MG tablet Take 1 tablet (1 mg total) by mouth daily as needed for anxiety.  30 tablet  1  . amphetamine-dextroamphetamine (ADDERALL XR) 20 MG 24 hr capsule Take 1 capsule (20 mg total) by mouth every morning. To fill Dec 25, 2012  30 capsule  0   No current facility-administered medications on file prior to visit.   Review of Systems All otherwise neg per pt     Objective:   Physical Exam BP 112/70  Pulse 49  Temp(Src) 97 F (36.1 C) (Oral)  Ht 5\' 10"  (1.778 m)  Wt 185 lb 2 oz (83.972 kg)  BMI 26.56 kg/m2  SpO2 98% VS noted,  Constitutional: Pt appears well-developed and well-nourished.  HENT: Head: NCAT.  Right Ear:  External ear normal.  Left Ear: External ear normal.  Eyes: Conjunctivae and EOM are normal. Pupils are equal, round, and reactive to light.  Neck: Normal range of motion. Neck supple.  Cardiovascular: Normal rate and regular rhythm.   Pulmonary/Chest: Effort normal and breath sounds normal.  Neurological: Pt is alert. Not confused  Skin: Skin is warm. No erythema. No LE edema Psychiatric: Pt behavior is normal. Thought content normal. 1-2+ nervous    Assessment & Plan:

## 2013-02-21 NOTE — Patient Instructions (Signed)
Ok to increase the lexapro to 20 mg (watch for sluggishness)  Please continue all other medications as before, including the ativan Please continue all other medications as before, and refills have been done if requested. Please have the pharmacy call with any other refills you may need. You will be contacted regarding the referral for: counseling (psychology)  Please remember to sign up for My Chart if you have not done so, as this will be important to you in the future with finding out test results, communicating by private email, and scheduling acute appointments online when needed.

## 2013-02-21 NOTE — Assessment & Plan Note (Signed)
Mild persistent symptoms, ok for increased lexapro to 20 mg, watch for side effect/sluggishness, refer counseling,  to f/u any worsening symptoms or concerns, cont to minimize ativan prn

## 2013-03-18 ENCOUNTER — Encounter: Payer: Self-pay | Admitting: Internal Medicine

## 2013-03-18 NOTE — Telephone Encounter (Signed)
Maxwell Herrera - see above

## 2013-03-29 DIAGNOSIS — Z0279 Encounter for issue of other medical certificate: Secondary | ICD-10-CM

## 2013-05-19 ENCOUNTER — Telehealth: Payer: Self-pay | Admitting: Internal Medicine

## 2013-05-19 NOTE — Telephone Encounter (Signed)
I will forward to [facility]

## 2013-05-19 NOTE — Telephone Encounter (Signed)
Patient would like you to contact him through [facility] in regards to who he was referred to for counseling.

## 2013-05-23 ENCOUNTER — Encounter: Payer: Self-pay | Admitting: Internal Medicine

## 2013-05-23 DIAGNOSIS — F909 Attention-deficit hyperactivity disorder, unspecified type: Secondary | ICD-10-CM

## 2013-05-23 DIAGNOSIS — F411 Generalized anxiety disorder: Secondary | ICD-10-CM

## 2013-05-24 ENCOUNTER — Ambulatory Visit (INDEPENDENT_AMBULATORY_CARE_PROVIDER_SITE_OTHER): Payer: BC Managed Care – PPO | Admitting: Internal Medicine

## 2013-05-24 ENCOUNTER — Encounter: Payer: Self-pay | Admitting: Internal Medicine

## 2013-05-24 VITALS — BP 110/80 | HR 67 | Temp 98.7°F | Ht 70.0 in | Wt 188.2 lb

## 2013-05-24 DIAGNOSIS — F909 Attention-deficit hyperactivity disorder, unspecified type: Secondary | ICD-10-CM

## 2013-05-24 DIAGNOSIS — G4700 Insomnia, unspecified: Secondary | ICD-10-CM

## 2013-05-24 DIAGNOSIS — F411 Generalized anxiety disorder: Secondary | ICD-10-CM

## 2013-05-24 MED ORDER — CLONAZEPAM 1 MG PO TABS: ORAL_TABLET | ORAL | Status: DC

## 2013-05-24 MED ORDER — AMPHETAMINE-DEXTROAMPHET ER 20 MG PO CP24: 20.0000 mg | ORAL_CAPSULE | ORAL | Status: DC

## 2013-05-24 NOTE — Assessment & Plan Note (Signed)
With minor depressive symptoms, no SI or HI, to cont lexapro 20, cont referral for counseling, and change ativan to klonopin bid prn,  to f/u any worsening symptoms or concerns

## 2013-05-24 NOTE — Progress Notes (Signed)
  Subjective:    Patient ID: Maxwell Herrera, male    DOB: 1986/03/24, 27 y.o.   MRN: [id number]  HPI  Here to f/u, despite good compliacne with meds has recurring panic attack, had to use the ativan tid yesterday, has some mild depressive moodiness but denies any significant effect..  Pt denies chest pain, increased sob or doe, wheezing, orthopnea, PND, increased LE swelling, palpitations, dizziness or syncope.  Pt denies new neurological symptoms such as new headache, or facial or extremity weakness or numbness   Pt denies polydipsia, polyuria. O/w functioning ok socially and not at risk for losing job on current adderall.  Also with some difficulty sleeping, hard to get to sleep and stay asleep. Past Medical History  Diagnosis Date  . ALLERGIC RHINITIS 03/21/2007  . ALLERGY, HX OF 03/19/2007  . ANXIETY 03/21/2007  . ASTHMA 12/12/2008  . IBS 03/19/2007  . OBESITY 03/19/2007  . SINUSITIS- ACUTE-NOS 12/27/2009  . ADHD (attention deficit hyperactivity disorder) 08/11/2011   Past Surgical History  Procedure Laterality Date  . Tonsillectomy    . Stomach surgery      s/p as toddler for malposition    reports that he has never smoked. He does not have any smokeless tobacco history on file. He reports that he does not drink alcohol or use illicit drugs. family history includes Asthma in his other. Allergies  Allergen Reactions  . Diphenhydramine Hcl    Current Outpatient Prescriptions on File Prior to Visit  Medication Sig Dispense Refill  . amphetamine-dextroamphetamine (ADDERALL XR) 20 MG 24 hr capsule Take 1 capsule (20 mg total) by mouth every morning. To fill Dec 25, 2012  30 capsule  0  . escitalopram (LEXAPRO) 20 MG tablet Take 1 tablet (20 mg total) by mouth daily.  90 tablet  3   No current facility-administered medications on file prior to visit.   Review of Systems  Constitutional: Negative for unexpected weight change, or unusual diaphoresis  HENT: Negative for tinnitus.   Eyes:  Negative for photophobia and visual disturbance.  Respiratory: Negative for choking and stridor.   Gastrointestinal: Negative for vomiting and blood in stool.  Genitourinary: Negative for hematuria and decreased urine volume.  Musculoskeletal: Negative for acute joint swelling Skin: Negative for color change and wound.  Neurological: Negative for tremors and numbness other than noted  Psychiatric/Behavioral: Negative for decreased concentration or  hyperactivity.        Objective:   Physical Exam BP 110/80  Pulse 67  Temp(Src) 98.7 F (37.1 C) (Oral)  Ht 5\' 10"  (1.778 m)  Wt 188 lb 4 oz (85.39 kg)  BMI 27.01 kg/m2  SpO2 97% VS noted,  Constitutional: Pt appears well-developed and well-nourished.  HENT: Head: NCAT.  Right Ear: External ear normal.  Left Ear: External ear normal.  Eyes: Conjunctivae and EOM are normal. Pupils are equal, round, and reactive to light.  Neck: Normal range of motion. Neck supple.  Cardiovascular: Normal rate and regular rhythm.   Pulmonary/Chest: Effort normal and breath sounds normal.  Abd:  Soft, NT, non-distended, + BS Neurological: Pt is alert. Not confused  Skin: Skin is warm. No erythema.  Psychiatric: Pt behavior is normal. Thought content normal.     Assessment & Plan:

## 2013-05-24 NOTE — Telephone Encounter (Signed)
Done hardcopy to robin  

## 2013-05-24 NOTE — Patient Instructions (Signed)
OK to stop the ativan (lorazepam) Please take all new medication as prescribed  - the clonazepam Please continue all other medications as before Please have the pharmacy call with any other refills you may need. Please keep your appointments with your specialists as you have planned  - counseling

## 2013-05-24 NOTE — Assessment & Plan Note (Signed)
stable overall by history and exam, and pt to continue medical treatment as before,  to f/u any worsening symptoms or concern 

## 2013-05-24 NOTE — Assessment & Plan Note (Signed)
Ok for ambien prn,.  to f/u any worsening symptoms or concerns  

## 2013-05-25 ENCOUNTER — Other Ambulatory Visit: Payer: Self-pay

## 2013-05-25 MED ORDER — ZOLPIDEM TARTRATE 5 MG PO TABS: 5.0000 mg | ORAL_TABLET | Freq: Every evening | ORAL | Status: DC | PRN

## 2013-05-25 NOTE — Telephone Encounter (Signed)
Faxed hardcopy for zolpidem to [facility] [place]

## 2013-05-25 NOTE — Addendum Note (Signed)
Addended by: Corwin Levins on: 05/25/2013 10:34 AM   Modules accepted: Orders

## 2013-06-01 ENCOUNTER — Ambulatory Visit (INDEPENDENT_AMBULATORY_CARE_PROVIDER_SITE_OTHER): Payer: BC Managed Care – PPO | Admitting: Psychology

## 2013-06-01 DIAGNOSIS — F411 Generalized anxiety disorder: Secondary | ICD-10-CM

## 2013-06-08 ENCOUNTER — Ambulatory Visit (INDEPENDENT_AMBULATORY_CARE_PROVIDER_SITE_OTHER): Payer: BC Managed Care – PPO | Admitting: Psychology

## 2013-06-08 DIAGNOSIS — F411 Generalized anxiety disorder: Secondary | ICD-10-CM

## 2013-06-09 ENCOUNTER — Other Ambulatory Visit: Payer: Self-pay

## 2013-06-15 ENCOUNTER — Ambulatory Visit (INDEPENDENT_AMBULATORY_CARE_PROVIDER_SITE_OTHER): Payer: BC Managed Care – PPO | Admitting: Psychology

## 2013-06-15 DIAGNOSIS — F411 Generalized anxiety disorder: Secondary | ICD-10-CM

## 2013-06-20 ENCOUNTER — Ambulatory Visit (INDEPENDENT_AMBULATORY_CARE_PROVIDER_SITE_OTHER): Payer: BC Managed Care – PPO | Admitting: Psychology

## 2013-06-20 DIAGNOSIS — F411 Generalized anxiety disorder: Secondary | ICD-10-CM

## 2013-06-22 ENCOUNTER — Ambulatory Visit: Payer: BC Managed Care – PPO | Admitting: Psychology

## 2013-06-24 ENCOUNTER — Ambulatory Visit (INDEPENDENT_AMBULATORY_CARE_PROVIDER_SITE_OTHER): Payer: BC Managed Care – PPO | Admitting: Psychology

## 2013-06-24 DIAGNOSIS — F411 Generalized anxiety disorder: Secondary | ICD-10-CM

## 2013-06-28 ENCOUNTER — Ambulatory Visit (INDEPENDENT_AMBULATORY_CARE_PROVIDER_SITE_OTHER): Payer: BC Managed Care – PPO | Admitting: Internal Medicine

## 2013-06-28 ENCOUNTER — Encounter: Payer: Self-pay | Admitting: Internal Medicine

## 2013-06-28 ENCOUNTER — Telehealth: Payer: Self-pay

## 2013-06-28 VITALS — BP 106/72 | HR 80 | Temp 97.8°F | Ht 70.0 in | Wt 199.1 lb

## 2013-06-28 DIAGNOSIS — F909 Attention-deficit hyperactivity disorder, unspecified type: Secondary | ICD-10-CM

## 2013-06-28 DIAGNOSIS — F411 Generalized anxiety disorder: Secondary | ICD-10-CM

## 2013-06-28 DIAGNOSIS — J45909 Unspecified asthma, uncomplicated: Secondary | ICD-10-CM

## 2013-06-28 DIAGNOSIS — Z0000 Encounter for general adult medical examination without abnormal findings: Secondary | ICD-10-CM

## 2013-06-28 NOTE — Assessment & Plan Note (Signed)
stable overall by history and exam, and pt to continue medical treatment as before,  to f/u any worsening symptoms or concerns;le  

## 2013-06-28 NOTE — Telephone Encounter (Signed)
Faxed Time Berlinda Last at [phone] paperwork to be taken out of work.  Faxed all paper work and then sent to scan per MD instructions for this patient.

## 2013-06-28 NOTE — Patient Instructions (Signed)
Please continue all other medications as before, and refills have been done if requested. Please have the pharmacy call with any other refills you may need. Please continue your counseling as you are doing The forms will be filled out to be faxed as well  Please remember to sign up for My Chart if you have not done so, as this will be important to you in the future with finding out test results, communicating by private email, and scheduling acute appointments online when needed.

## 2013-06-28 NOTE — Progress Notes (Signed)
Subjective:    Patient ID: Maxwell Herrera, male    DOB: Oct 19, 1985, 27 y.o.   MRN: [id number]  HPI  Here to f/u, has been seeing counseling with documentation of increased panic attacks including 4 days in a row prior to having to stop work nov 17 temporarily. Has mult work and social stressors, tends to take on more than what he is responsible for, and then gets stretched too thin.  Current meds working ok, does not need refills.  Denies worsening depressive symptoms, suicidal ideation.  Asks for forms filled out for out of work.  Pt denies chest pain, increased sob or doe, wheezing, orthopnea, PND, increased LE swelling, palpitations, dizziness or syncope, except for last panic attack where he hyperventilated and near syncope.  Feels much improved already today after being away from work since nov 17.  Therapist suggested return to work date of dec 5. Past Medical History  Diagnosis Date  . ALLERGIC RHINITIS 03/21/2007  . ALLERGY, HX OF 03/19/2007  . ANXIETY 03/21/2007  . ASTHMA 12/12/2008  . IBS 03/19/2007  . OBESITY 03/19/2007  . SINUSITIS- ACUTE-NOS 12/27/2009  . ADHD (attention deficit hyperactivity disorder) 08/11/2011   Past Surgical History  Procedure Laterality Date  . Tonsillectomy    . Stomach surgery      s/p as toddler for malposition    reports that he has never smoked. He does not have any smokeless tobacco history on file. He reports that he does not drink alcohol or use illicit drugs. family history includes Asthma in his other. Allergies  Allergen Reactions  . Diphenhydramine Hcl    Current Outpatient Prescriptions on File Prior to Visit  Medication Sig Dispense Refill  . amphetamine-dextroamphetamine (ADDERALL XR) 20 MG 24 hr capsule Take 1 capsule (20 mg total) by mouth every morning. To fill Dec 25, 2012  30 capsule  0  . clonazePAM (KLONOPIN) 1 MG tablet 1/2 - 1 tab by mouth twice per day as needed  60 tablet  1  . escitalopram (LEXAPRO) 20 MG tablet Take 1 tablet (20 mg  total) by mouth daily.  90 tablet  3  . zolpidem (AMBIEN) 5 MG tablet Take 1 tablet (5 mg total) by mouth at bedtime as needed for sleep.  30 tablet  5   No current facility-administered medications on file prior to visit.    Review of Systems  Constitutional: Negative for unexpected weight change, or unusual diaphoresis  HENT: Negative for tinnitus.   Eyes: Negative for photophobia and visual disturbance.  Respiratory: Negative for choking and stridor.   Gastrointestinal: Negative for vomiting and blood in stool.  Genitourinary: Negative for hematuria and decreased urine volume.  Musculoskeletal: Negative for acute joint swelling Skin: Negative for color change and wound.  Neurological: Negative for tremors and numbness other than noted  Psychiatric/Behavioral: Negative for decreased concentration or  hyperactivity.       Objective:   Physical Exam BP 106/72  Pulse 80  Temp(Src) 97.8 F (36.6 C) (Oral)  Ht 5\' 10"  (1.778 m)  Wt 199 lb 2 oz (90.323 kg)  BMI 28.57 kg/m2  SpO2 97% VS noted,  Constitutional: Pt appears well-developed and well-nourished.  HENT: Head: NCAT.  Right Ear: External ear normal.  Left Ear: External ear normal.  Eyes: Conjunctivae and EOM are normal. Pupils are equal, round, and reactive to light.  Neck: Normal range of motion. Neck supple.  Cardiovascular: Normal rate and regular rhythm.   Pulmonary/Chest: Effort normal and breath sounds normal.  Neurological: Pt is alert. Not confused  Skin: Skin is warm. No erythema.  Psychiatric: Pt behavior is normal. Thought content normal. mild nervous    Assessment & Plan:

## 2013-06-28 NOTE — Assessment & Plan Note (Signed)
Worsening recently, to cont same meds, for work release as suggested, cont counseling, verified no worsening depression or SI

## 2013-06-28 NOTE — Assessment & Plan Note (Signed)
stable overall by history and exam, recent data reviewed with pt, and pt to continue medical treatment as before,  to f/u any worsening symptoms or concerns  

## 2013-06-29 ENCOUNTER — Ambulatory Visit (INDEPENDENT_AMBULATORY_CARE_PROVIDER_SITE_OTHER): Payer: BC Managed Care – PPO | Admitting: Psychology

## 2013-06-29 DIAGNOSIS — F411 Generalized anxiety disorder: Secondary | ICD-10-CM

## 2013-07-06 ENCOUNTER — Ambulatory Visit (INDEPENDENT_AMBULATORY_CARE_PROVIDER_SITE_OTHER): Payer: BC Managed Care – PPO | Admitting: Psychology

## 2013-07-06 DIAGNOSIS — F411 Generalized anxiety disorder: Secondary | ICD-10-CM

## 2013-07-13 ENCOUNTER — Ambulatory Visit (INDEPENDENT_AMBULATORY_CARE_PROVIDER_SITE_OTHER): Payer: BC Managed Care – PPO | Admitting: Psychology

## 2013-07-13 DIAGNOSIS — F411 Generalized anxiety disorder: Secondary | ICD-10-CM

## 2013-07-20 ENCOUNTER — Ambulatory Visit (INDEPENDENT_AMBULATORY_CARE_PROVIDER_SITE_OTHER): Payer: BC Managed Care – PPO | Admitting: Psychology

## 2013-07-20 DIAGNOSIS — F411 Generalized anxiety disorder: Secondary | ICD-10-CM

## 2013-08-03 ENCOUNTER — Ambulatory Visit (INDEPENDENT_AMBULATORY_CARE_PROVIDER_SITE_OTHER): Payer: BC Managed Care – PPO | Admitting: Psychology

## 2013-08-03 DIAGNOSIS — F411 Generalized anxiety disorder: Secondary | ICD-10-CM

## 2013-08-10 ENCOUNTER — Ambulatory Visit (INDEPENDENT_AMBULATORY_CARE_PROVIDER_SITE_OTHER): Payer: BC Managed Care – PPO | Admitting: Psychology

## 2013-08-10 DIAGNOSIS — F411 Generalized anxiety disorder: Secondary | ICD-10-CM

## 2013-08-17 ENCOUNTER — Ambulatory Visit (INDEPENDENT_AMBULATORY_CARE_PROVIDER_SITE_OTHER): Payer: BC Managed Care – PPO | Admitting: Psychology

## 2013-08-17 DIAGNOSIS — F411 Generalized anxiety disorder: Secondary | ICD-10-CM

## 2013-08-31 ENCOUNTER — Ambulatory Visit (INDEPENDENT_AMBULATORY_CARE_PROVIDER_SITE_OTHER): Payer: BC Managed Care – PPO | Admitting: Psychology

## 2013-08-31 DIAGNOSIS — F411 Generalized anxiety disorder: Secondary | ICD-10-CM

## 2013-09-14 ENCOUNTER — Ambulatory Visit (INDEPENDENT_AMBULATORY_CARE_PROVIDER_SITE_OTHER): Payer: BC Managed Care – PPO | Admitting: Psychology

## 2013-09-14 DIAGNOSIS — F411 Generalized anxiety disorder: Secondary | ICD-10-CM

## 2013-09-19 ENCOUNTER — Telehealth: Payer: Self-pay | Admitting: *Deleted

## 2013-09-19 DIAGNOSIS — F909 Attention-deficit hyperactivity disorder, unspecified type: Secondary | ICD-10-CM

## 2013-09-19 NOTE — Telephone Encounter (Signed)
Patient phoned requesting refills (3 months worth) for his adderall.  Last OV with PCP 06/28/13 and last ordered 06/28/13.  CB# [phone]

## 2013-09-21 MED ORDER — AMPHETAMINE-DEXTROAMPHET ER 20 MG PO CP24: 20.0000 mg | ORAL_CAPSULE | ORAL | Status: DC

## 2013-09-21 NOTE — Telephone Encounter (Signed)
Called the patient informed to pickup hardcopy's at the front desk.  Also explained Controlled pain medication letter to complete/sign and UDS to do before getting refills.  The patient agreed to instructions.

## 2013-09-23 ENCOUNTER — Encounter: Payer: Self-pay | Admitting: Internal Medicine

## 2013-09-23 ENCOUNTER — Ambulatory Visit (INDEPENDENT_AMBULATORY_CARE_PROVIDER_SITE_OTHER): Payer: BC Managed Care – PPO | Admitting: Internal Medicine

## 2013-09-23 VITALS — BP 118/80 | HR 67 | Temp 98.7°F | Ht 70.0 in | Wt 198.5 lb

## 2013-09-23 DIAGNOSIS — F411 Generalized anxiety disorder: Secondary | ICD-10-CM

## 2013-09-23 DIAGNOSIS — F909 Attention-deficit hyperactivity disorder, unspecified type: Secondary | ICD-10-CM

## 2013-09-23 DIAGNOSIS — J45909 Unspecified asthma, uncomplicated: Secondary | ICD-10-CM

## 2013-09-23 NOTE — Assessment & Plan Note (Signed)
stable overall by history and exam, recent data reviewed with pt, and pt to continue medical treatment as before,  to f/u any worsening symptoms or concerns SpO2 Readings from Last 3 Encounters:  09/23/13 97%  06/28/13 97%  05/24/13 97%

## 2013-09-23 NOTE — Assessment & Plan Note (Signed)
stable overall by history and exam, and pt to continue medical treatment as before,  to f/u any worsening symptoms or concerns 

## 2013-09-23 NOTE — Patient Instructions (Signed)
Please continue all other medications as before, and refills have been done if requested. Please have the pharmacy call with any other refills you may need.  You are given the work note for today, and to include the next 2 wks as well, due to the recurrent panic attacks.  You will be contacted regarding the referral for: psychiatry

## 2013-09-23 NOTE — Progress Notes (Signed)
Subjective:    Patient ID: Maxwell Herrera, male    DOB: February 01, 1986, 28 y.o.   MRN: [id number]  HPI  Here to f/u with acute, unfort had a stressful day more than usual, with a panic attack with hyperventilation and presyncope, despite good compliance with current meds.  Still seeing counseling as well.  Pt denies chest pain, increased sob or doe, wheezing, orthopnea, PND, increased LE swelling, palpitations, dizziness or syncope except for the above.  Pt denies new neurological symptoms such as new headache, or facial or extremity weakness or numbness, though did have an episode of staring at a computer screen a wk ago for some short time, and was unaware of surroundings, "came to" when poked by a co-worker, wondering about ? [employer]. No tonic/clonic movements. Still working [employer] for [employer], asks for 2 mo short term disability. Good compliacne with ADD med, does not abuse or overuse Past Medical History  Diagnosis Date  . ALLERGIC RHINITIS 03/21/2007  . ALLERGY, HX OF 03/19/2007  . ANXIETY 03/21/2007  . ASTHMA 12/12/2008  . IBS 03/19/2007  . OBESITY 03/19/2007  . SINUSITIS- ACUTE-NOS 12/27/2009  . ADHD (attention deficit hyperactivity disorder) 08/11/2011   Past Surgical History  Procedure Laterality Date  . Tonsillectomy    . Stomach surgery      s/p as toddler for malposition    reports that he has never smoked. He does not have any smokeless tobacco history on file. He reports that he does not drink alcohol or use illicit drugs. family history includes Asthma in his other. Allergies  Allergen Reactions  . Diphenhydramine Hcl    Current Outpatient Prescriptions on File Prior to Visit  Medication Sig Dispense Refill  . amphetamine-dextroamphetamine (ADDERALL XR) 20 MG 24 hr capsule Take 1 capsule (20 mg total) by mouth every morning. To fill Nov 19, 2013  30 capsule  0  . clonazePAM (KLONOPIN) 1 MG tablet 1/2 - 1 tab by mouth twice per day as needed  60 tablet  1  .  escitalopram (LEXAPRO) 20 MG tablet Take 1 tablet (20 mg total) by mouth daily.  90 tablet  3  . zolpidem (AMBIEN) 5 MG tablet Take 1 tablet (5 mg total) by mouth at bedtime as needed for sleep.  30 tablet  5   No current facility-administered medications on file prior to visit.   Review of Systems  Constitutional: Negative for unexpected weight change, or unusual diaphoresis  HENT: Negative for tinnitus.   Eyes: Negative for photophobia and visual disturbance.  Respiratory: Negative for choking and stridor.   Gastrointestinal: Negative for vomiting and blood in stool.  Genitourinary: Negative for hematuria and decreased urine volume.  Musculoskeletal: Negative for acute joint swelling Skin: Negative for color change and wound.  Neurological: Negative for tremors and numbness other than noted  Psychiatric/Behavioral: Negative for decreased concentration or  hyperactivity.       Objective:   Physical Exam BP 118/80  Pulse 67  Temp(Src) 98.7 F (37.1 C) (Oral)  Ht 5\' 10"  (1.778 m)  Wt 198 lb 8 oz (90.039 kg)  BMI 28.48 kg/m2  SpO2 97% VS noted,  Constitutional: Pt appears well-developed and well-nourished.  HENT: Head: NCAT.  Right Ear: External ear normal.  Left Ear: External ear normal.  Eyes: Conjunctivae and EOM are normal. Pupils are equal, round, and reactive to light.  Neck: Normal range of motion. Neck supple.  Cardiovascular: Normal rate and regular rhythm.   Pulmonary/Chest: Effort normal  and breath sounds normal.  Abd:  Soft, NT, non-distended, + BS Neurological: Pt is alert. Not confused  Skin: Skin is warm. No erythema.  Psychiatric: Pt behavior is normal. Thought content normal. 1+ nervous    Assessment & Plan:

## 2013-09-23 NOTE — Progress Notes (Signed)
Pre-visit discussion using our clinic review tool. No additional management support is needed unless otherwise documented below in the visit note.  

## 2013-09-23 NOTE — Assessment & Plan Note (Addendum)
With recurrent panic, to cont meds as is, cont counseling, for psychiatry referral, ok for 2 wks work leave and off work today (but 2 mo would be extraordinary)

## 2013-09-28 ENCOUNTER — Ambulatory Visit (INDEPENDENT_AMBULATORY_CARE_PROVIDER_SITE_OTHER): Payer: BC Managed Care – PPO | Admitting: Psychology

## 2013-09-28 ENCOUNTER — Ambulatory Visit: Payer: BC Managed Care – PPO | Admitting: Internal Medicine

## 2013-09-28 DIAGNOSIS — F411 Generalized anxiety disorder: Secondary | ICD-10-CM

## 2013-10-03 ENCOUNTER — Encounter: Payer: Self-pay | Admitting: Internal Medicine

## 2013-10-05 ENCOUNTER — Ambulatory Visit (INDEPENDENT_AMBULATORY_CARE_PROVIDER_SITE_OTHER): Payer: BC Managed Care – PPO | Admitting: Psychology

## 2013-10-05 DIAGNOSIS — F411 Generalized anxiety disorder: Secondary | ICD-10-CM

## 2013-10-06 ENCOUNTER — Encounter: Payer: Self-pay | Admitting: Internal Medicine

## 2013-10-10 ENCOUNTER — Telehealth: Payer: Self-pay | Admitting: *Deleted

## 2013-10-10 NOTE — Telephone Encounter (Signed)
Just received Mr. Maxwell Herrera's form today. Will fill out and forward to provider this week. Can check status in patient's FYI tab. Closing encounter.

## 2013-10-10 NOTE — Telephone Encounter (Signed)
Patient phoned requesting status on his short term disability forms-states the deadline for submission is 10/19/13  CB# [phone]

## 2013-10-17 ENCOUNTER — Telehealth: Payer: Self-pay | Admitting: *Deleted

## 2013-10-17 NOTE — Telephone Encounter (Signed)
Pt called requesting status of Disability forms that were dropped off on 3.9.15.  Further states forms are to be turned in by Wed 3.18.15.  Please advise

## 2013-10-17 NOTE — Telephone Encounter (Signed)
Will follow up. Thx °

## 2013-10-18 DIAGNOSIS — Z0279 Encounter for issue of other medical certificate: Secondary | ICD-10-CM

## 2013-10-19 ENCOUNTER — Ambulatory Visit (INDEPENDENT_AMBULATORY_CARE_PROVIDER_SITE_OTHER): Payer: BC Managed Care – PPO | Admitting: Psychology

## 2013-10-19 DIAGNOSIS — F411 Generalized anxiety disorder: Secondary | ICD-10-CM

## 2013-10-21 ENCOUNTER — Telehealth: Payer: Self-pay

## 2013-10-21 NOTE — Telephone Encounter (Signed)
Phone call from SchofieldSheila with [employer] [phone] stating additional information is needed on disability forms that have been submitted. Specifically office dates, if he's been referred to other doctors, what's preventing him to return to work and with what accomodations.

## 2013-10-24 NOTE — Telephone Encounter (Signed)
Will follow up. Thx °

## 2013-10-26 ENCOUNTER — Ambulatory Visit: Payer: BC Managed Care – PPO | Admitting: Psychology

## 2013-11-02 ENCOUNTER — Ambulatory Visit (INDEPENDENT_AMBULATORY_CARE_PROVIDER_SITE_OTHER): Payer: BC Managed Care – PPO | Admitting: Psychology

## 2013-11-02 DIAGNOSIS — F411 Generalized anxiety disorder: Secondary | ICD-10-CM

## 2013-11-09 ENCOUNTER — Ambulatory Visit (INDEPENDENT_AMBULATORY_CARE_PROVIDER_SITE_OTHER): Payer: BC Managed Care – PPO | Admitting: Psychology

## 2013-11-09 DIAGNOSIS — F409 Phobic anxiety disorder, unspecified: Secondary | ICD-10-CM

## 2013-11-16 ENCOUNTER — Ambulatory Visit (INDEPENDENT_AMBULATORY_CARE_PROVIDER_SITE_OTHER): Payer: BC Managed Care – PPO | Admitting: Psychology

## 2013-11-16 DIAGNOSIS — F411 Generalized anxiety disorder: Secondary | ICD-10-CM

## 2013-11-25 ENCOUNTER — Telehealth: Payer: Self-pay | Admitting: *Deleted

## 2013-11-25 MED ORDER — CLONAZEPAM 1 MG PO TABS: ORAL_TABLET | ORAL | Status: DC

## 2013-11-25 NOTE — Telephone Encounter (Signed)
Pt called requesting Clonazepam refill.  Last office visit 2.20.15.  Please advise

## 2013-11-25 NOTE — Telephone Encounter (Signed)
Done hardcopy to robin  

## 2013-11-25 NOTE — Telephone Encounter (Signed)
Pt.notified

## 2013-11-30 ENCOUNTER — Ambulatory Visit (INDEPENDENT_AMBULATORY_CARE_PROVIDER_SITE_OTHER): Payer: BC Managed Care – PPO | Admitting: Psychology

## 2013-11-30 DIAGNOSIS — F411 Generalized anxiety disorder: Secondary | ICD-10-CM

## 2013-12-07 ENCOUNTER — Ambulatory Visit: Payer: BC Managed Care – PPO | Admitting: Psychology

## 2013-12-07 ENCOUNTER — Ambulatory Visit (INDEPENDENT_AMBULATORY_CARE_PROVIDER_SITE_OTHER): Payer: BC Managed Care – PPO | Admitting: Internal Medicine

## 2013-12-07 ENCOUNTER — Encounter: Payer: Self-pay | Admitting: Internal Medicine

## 2013-12-07 VITALS — BP 100/68 | HR 58 | Temp 98.5°F | Ht 70.0 in | Wt 203.4 lb

## 2013-12-07 DIAGNOSIS — J45909 Unspecified asthma, uncomplicated: Secondary | ICD-10-CM

## 2013-12-07 DIAGNOSIS — J019 Acute sinusitis, unspecified: Secondary | ICD-10-CM

## 2013-12-07 DIAGNOSIS — F909 Attention-deficit hyperactivity disorder, unspecified type: Secondary | ICD-10-CM

## 2013-12-07 DIAGNOSIS — F411 Generalized anxiety disorder: Secondary | ICD-10-CM

## 2013-12-07 MED ORDER — LEVOFLOXACIN 250 MG PO TABS: 250.0000 mg | ORAL_TABLET | Freq: Every day | ORAL | Status: DC

## 2013-12-07 NOTE — Patient Instructions (Signed)
Please take all new medication as prescribed  Please continue all other medications as before, and refills have been done if requested. Please have the pharmacy call with any other refills you may need.  You are given the work note today  Please keep your appointments with your specialists as you have planned

## 2013-12-07 NOTE — Assessment & Plan Note (Signed)
stable overall by history and exam, recent data reviewed with pt, and pt to continue medical treatment as before,  to f/u any worsening symptoms or concerns SpO2 Readings from Last 3 Encounters:  12/07/13 97%  09/23/13 97%  06/28/13 97%

## 2013-12-07 NOTE — Progress Notes (Signed)
Pre visit review using our clinic review tool, if applicable. No additional management support is needed unless otherwise documented below in the visit note. 

## 2013-12-07 NOTE — Assessment & Plan Note (Signed)
Mild to mod, for antibx course,  to f/u any worsening symptoms or concerns 

## 2013-12-07 NOTE — Assessment & Plan Note (Signed)
stable overall by history and exam, and pt to continue medical treatment as before,  to f/u any worsening symptoms or concerns 

## 2013-12-07 NOTE — Progress Notes (Signed)
Subjective:    Patient ID: Maxwell Herrera, male    DOB: 04-02-1986, 28 y.o.   MRN: [id number]  HPI   Here with 2-3 days acute onset fever, facial pain, pressure, headache, general weakness and malaise, and greenish d/c, with mild ST and cough, but pt denies chest pain, wheezing, increased sob or doe, orthopnea, PND, increased LE swelling, palpitations, dizziness or syncope.  Also has note from his therapist that he has been unable to be eval per psychiatry due to cost so far (although he is looking into a program at work that could help mitigate the cost), and rec'd extension from may 9 of light duty for 2 more months. Denies worsening depressive symptoms, suicidal ideation, or panic; has ongoing anxiety, ongoing but not worsening in last few wks.  ADHD meds still working well, o/w functioning and productive at work and socially Past Medical History  Diagnosis Date  . ALLERGIC RHINITIS 03/21/2007  . ALLERGY, HX OF 03/19/2007  . ANXIETY 03/21/2007  . ASTHMA 12/12/2008  . IBS 03/19/2007  . OBESITY 03/19/2007  . SINUSITIS- ACUTE-NOS 12/27/2009  . ADHD (attention deficit hyperactivity disorder) 08/11/2011   Past Surgical History  Procedure Laterality Date  . Tonsillectomy    . Stomach surgery      s/p as toddler for malposition    reports that he has never smoked. He does not have any smokeless tobacco history on file. He reports that he does not drink alcohol or use illicit drugs. family history includes Asthma in his other. Allergies  Allergen Reactions  . Diphenhydramine Hcl    Current Outpatient Prescriptions on File Prior to Visit  Medication Sig Dispense Refill  . clonazePAM (KLONOPIN) 1 MG tablet 1/2 - 1 tab by mouth twice per day as needed  60 tablet  1  . escitalopram (LEXAPRO) 20 MG tablet Take 1 tablet (20 mg total) by mouth daily.  90 tablet  3  . zolpidem (AMBIEN) 5 MG tablet Take 1 tablet (5 mg total) by mouth at bedtime as needed for sleep.  30 tablet  5  .  amphetamine-dextroamphetamine (ADDERALL XR) 20 MG 24 hr capsule Take 1 capsule (20 mg total) by mouth every morning. To fill Nov 19, 2013  30 capsule  0   No current facility-administered medications on file prior to visit.   Review of Systems  Constitutional: Negative for unusual diaphoresis or other sweats  HENT: Negative for ringing in ear Eyes: Negative for double vision or worsening visual disturbance.  Respiratory: Negative for choking and stridor.   Gastrointestinal: Negative for vomiting or other signifcant bowel change Genitourinary: Negative for hematuria or decreased urine volume.  Musculoskeletal: Negative for other MSK pain or swelling Skin: Negative for color change and worsening wound.  Neurological: Negative for tremors and numbness other than noted  Psychiatric/Behavioral: Negative for decreased concentration or agitation other than above       Objective:   Physical Exam BP 100/68  Pulse 58  Temp(Src) 98.5 F (36.9 C) (Oral)  Ht 5\' 10"  (1.778 m)  Wt 203 lb 6 oz (92.25 kg)  BMI 29.18 kg/m2  SpO2 97% VS noted,  Constitutional: Pt appears well-developed, well-nourished.  HENT: Head: NCAT.  Right Ear: External ear normal.  Left Ear: External ear normal.  Eyes: . Pupils are equal, round, and reactive to light. Conjunctivae and EOM are normal Bilat tm's with mild erythema with left severe however.  Max sinus areas mild tender.  Pharynx with mild erythema, no exudate Neck:  Normal range of motion. Neck supple.  Cardiovascular: Normal rate and regular rhythm.   Pulmonary/Chest: Effort normal and breath sounds normal.  Neurological: Pt is alert. Not confused , motor grossly intact Skin: Skin is warm. No rash Psychiatric: Pt behavior is normal. No agitation.mild nervous, not depressed affect    Assessment & Plan:

## 2013-12-07 NOTE — Assessment & Plan Note (Signed)
Ok for note for extension of light duty at work, cont same tx

## 2013-12-14 ENCOUNTER — Ambulatory Visit (INDEPENDENT_AMBULATORY_CARE_PROVIDER_SITE_OTHER): Payer: BC Managed Care – PPO | Admitting: Psychology

## 2013-12-14 DIAGNOSIS — F411 Generalized anxiety disorder: Secondary | ICD-10-CM

## 2013-12-21 ENCOUNTER — Ambulatory Visit: Payer: BC Managed Care – PPO | Admitting: Psychology

## 2013-12-28 ENCOUNTER — Ambulatory Visit (INDEPENDENT_AMBULATORY_CARE_PROVIDER_SITE_OTHER): Payer: BC Managed Care – PPO | Admitting: Psychology

## 2013-12-28 DIAGNOSIS — F411 Generalized anxiety disorder: Secondary | ICD-10-CM

## 2014-01-04 ENCOUNTER — Ambulatory Visit (INDEPENDENT_AMBULATORY_CARE_PROVIDER_SITE_OTHER): Payer: BC Managed Care – PPO | Admitting: Psychology

## 2014-01-04 DIAGNOSIS — F411 Generalized anxiety disorder: Secondary | ICD-10-CM

## 2014-01-11 ENCOUNTER — Ambulatory Visit: Payer: BC Managed Care – PPO | Admitting: Psychology

## 2014-01-18 ENCOUNTER — Ambulatory Visit (INDEPENDENT_AMBULATORY_CARE_PROVIDER_SITE_OTHER): Payer: BC Managed Care – PPO | Admitting: Psychology

## 2014-01-18 DIAGNOSIS — F411 Generalized anxiety disorder: Secondary | ICD-10-CM

## 2014-01-25 ENCOUNTER — Ambulatory Visit: Payer: BC Managed Care – PPO | Admitting: Psychology

## 2014-02-01 ENCOUNTER — Ambulatory Visit: Payer: BC Managed Care – PPO | Admitting: Psychology

## 2014-05-19 ENCOUNTER — Other Ambulatory Visit: Payer: Self-pay

## 2016-09-17 ENCOUNTER — Encounter (HOSPITAL_BASED_OUTPATIENT_CLINIC_OR_DEPARTMENT_OTHER): Payer: Self-pay

## 2016-09-17 ENCOUNTER — Emergency Department (HOSPITAL_BASED_OUTPATIENT_CLINIC_OR_DEPARTMENT_OTHER)
Admission: EM | Admit: 2016-09-17 | Discharge: 2016-09-17 | Disposition: A | Discharge status: Home / Self Care | Payer: BLUE CROSS/BLUE SHIELD | Attending: Emergency Medicine | Admitting: Emergency Medicine

## 2016-09-17 DIAGNOSIS — F909 Attention-deficit hyperactivity disorder, unspecified type: Secondary | ICD-10-CM | POA: Insufficient documentation

## 2016-09-17 DIAGNOSIS — X58XXXA Exposure to other specified factors, initial encounter: Secondary | ICD-10-CM | POA: Insufficient documentation

## 2016-09-17 DIAGNOSIS — S29012A Strain of muscle and tendon of back wall of thorax, initial encounter: Secondary | ICD-10-CM | POA: Diagnosis not present

## 2016-09-17 DIAGNOSIS — J45909 Unspecified asthma, uncomplicated: Secondary | ICD-10-CM | POA: Diagnosis not present

## 2016-09-17 DIAGNOSIS — S39012A Strain of muscle, fascia and tendon of lower back, initial encounter: Secondary | ICD-10-CM

## 2016-09-17 DIAGNOSIS — Y92009 Unspecified place in unspecified non-institutional (private) residence as the place of occurrence of the external cause: Secondary | ICD-10-CM | POA: Diagnosis not present

## 2016-09-17 DIAGNOSIS — Y9389 Activity, other specified: Secondary | ICD-10-CM | POA: Insufficient documentation

## 2016-09-17 DIAGNOSIS — Z79899 Other long term (current) drug therapy: Secondary | ICD-10-CM | POA: Diagnosis not present

## 2016-09-17 DIAGNOSIS — Y999 Unspecified external cause status: Secondary | ICD-10-CM | POA: Diagnosis not present

## 2016-09-17 DIAGNOSIS — S299XXA Unspecified injury of thorax, initial encounter: Secondary | ICD-10-CM | POA: Diagnosis present

## 2016-09-17 MED ORDER — KETOROLAC TROMETHAMINE 60 MG/2ML IM SOLN
60.0000 mg | Freq: Once | INTRAMUSCULAR | Status: AC
  Administered 2016-09-17: 60 mg via INTRAMUSCULAR
  Filled 2016-09-17: qty 2

## 2016-09-17 MED ORDER — TRAMADOL HCL 50 MG PO TABS
100.0000 mg | ORAL_TABLET | Freq: Once | ORAL | Status: AC
  Administered 2016-09-17: 100 mg via ORAL
  Filled 2016-09-17: qty 2

## 2016-09-17 NOTE — ED Triage Notes (Signed)
Pt c/o mid back pain, states feels like muscle pain after putting up curtain brackets. States took Ibuprofen 600mg  with no relief

## 2016-09-17 NOTE — ED Provider Notes (Signed)
[facility] Provider Note   CSN: [id number] Arrival date & time: 09/17/16  0427     History   Chief Complaint Chief Complaint  Patient presents with  . Back Pain    HPI Maxwell Herrera is a 31 y.o. male no sig PMH here with back pain starting yesterday at 4pm after putting up curtains in the house.  He had no pain at that time, but the pain started to slowly worsen throughout the day. He took ibuprofen 600mg  without relief.  The pain is in the mid thoracic back with radiation bilaterally outwards.  He denies having any back problems in the past.  He denies any neuro symptoms associated. There are no further complaints.  10 Systems reviewed and are negative for acute change except as noted in the HPI.   HPI  Past Medical History:  Diagnosis Date  . ADHD (attention deficit hyperactivity disorder) 08/11/2011  . ALLERGIC RHINITIS 03/21/2007  . ALLERGY, HX OF 03/19/2007  . ANXIETY 03/21/2007  . ASTHMA 12/12/2008  . IBS 03/19/2007  . OBESITY 03/19/2007  . SINUSITIS- ACUTE-NOS 12/27/2009    Patient Active Problem List   Diagnosis Date Noted  . Acute sinus infection 12/07/2013  . Insomnia 05/24/2013  . ADHD (attention deficit hyperactivity disorder) 08/11/2011  . Preventative health care 06/19/2011  . ASTHMA 12/12/2008  . ANXIETY 03/21/2007  . ALLERGIC RHINITIS 03/21/2007  . OBESITY 03/19/2007  . IBS 03/19/2007    Past Surgical History:  Procedure Laterality Date  . STOMACH SURGERY     s/p as toddler for malposition  . TONSILLECTOMY         Home Medications    Prior to Admission medications   Medication Sig Start Date End Date Taking? Authorizing Provider  clonazePAM (KLONOPIN) 1 MG tablet 1/2 - 1 tab by mouth twice per day as needed 11/25/13   Corwin Levins, MD  escitalopram (LEXAPRO) 20 MG tablet Take 1 tablet (20 mg total) by mouth daily. 02/21/13   Corwin Levins, MD    Family History Family History  Problem Relation Age of Onset  . Asthma Other      Social History Social History  Substance Use Topics  . Smoking status: Never Smoker  . Smokeless tobacco: Never Used  . Alcohol use No     Allergies   Diphenhydramine hcl   Review of Systems Review of Systems   Physical Exam Updated Vital Signs BP 127/76 (BP Location: Right Arm)   Pulse 87   Temp 97.5 F (36.4 C) (Oral)   Resp 18   Ht 5\' 10"  (1.778 m)   Wt 215 lb (97.5 kg)   SpO2 98%   BMI 30.85 kg/m   Physical Exam  Constitutional: He is oriented to person, place, and time. Vital signs are normal. He appears well-developed and well-nourished.  Non-toxic appearance. He does not appear ill. No distress.  HENT:  Head: Normocephalic and atraumatic.  Nose: Nose normal.  Mouth/Throat: Oropharynx is clear and moist. No oropharyngeal exudate.  Eyes: Conjunctivae and EOM are normal. Pupils are equal, round, and reactive to light. No scleral icterus.  Neck: Normal range of motion. Neck supple. No tracheal deviation, no edema, no erythema and normal range of motion present. No thyroid mass and no thyromegaly present.  Cardiovascular: Normal rate, regular rhythm, S1 normal, S2 normal, normal heart sounds, intact distal pulses and normal pulses.  Exam reveals no gallop and no friction rub.   No murmur heard. Pulmonary/Chest: Effort normal and  breath sounds normal. No respiratory distress. He has no wheezes. He has no rhonchi. He has no rales.  Abdominal: Soft. Normal appearance and bowel sounds are normal. He exhibits no distension, no ascites and no mass. There is no hepatosplenomegaly. There is no tenderness. There is no rebound, no guarding and no CVA tenderness.  Musculoskeletal: Normal range of motion. He exhibits no edema or tenderness.  Lymphadenopathy:    He has no cervical adenopathy.  Neurological: He is alert and oriented to person, place, and time. He has normal strength. No cranial nerve deficit or sensory deficit.  Normal strength and sensation in all  extremities. Normal cerebellar testing.  Skin: Skin is warm, dry and intact. No petechiae and no rash noted. He is not diaphoretic. No erythema. No pallor.  Nursing note and vitals reviewed.    ED Treatments / Results  Labs (all labs ordered are listed, but only abnormal results are displayed) Labs Reviewed - No data to display  EKG  EKG Interpretation None       Radiology No results found.  Procedures Procedures (including critical care time)  Medications Ordered in ED Medications  traMADol (ULTRAM) tablet 100 mg (not administered)  ketorolac (TORADOL) injection 60 mg (not administered)     Initial Impression / Assessment and Plan / ED Course  I have reviewed the triage vital signs and the nursing notes.  Pertinent labs & imaging results that were available during my care of the patient were reviewed by me and considered in my medical decision making (see chart for details).    Patient presents to emergency department for back pain after straining with recurrence. He was given Toradol and tramadol in emergency department for relief. Advised on Tylenol and ibuprofen use at home as well as ice packs.  Primary care follow-up advised within 3 days. Neurological exam is completely normal. He has no significant tenderness to palpation of his back. He appears well in no acute distress, vital signs were within his normal limits and he is safe for discharge.   Final Clinical Impressions(s) / ED Diagnoses   Final diagnoses:  Back strain, initial encounter    New Prescriptions New Prescriptions   No medications on file     Tomasita CrumbleAdeleke Luisenrique Conran, MD 09/17/16 [phone]

## 2016-09-19 ENCOUNTER — Encounter (HOSPITAL_BASED_OUTPATIENT_CLINIC_OR_DEPARTMENT_OTHER): Payer: Self-pay

## 2016-09-19 ENCOUNTER — Emergency Department (HOSPITAL_BASED_OUTPATIENT_CLINIC_OR_DEPARTMENT_OTHER)
Admission: EM | Admit: 2016-09-19 | Discharge: 2016-09-19 | Disposition: A | Discharge status: Home / Self Care | Payer: BLUE CROSS/BLUE SHIELD | Attending: Emergency Medicine | Admitting: Emergency Medicine

## 2016-09-19 DIAGNOSIS — Y999 Unspecified external cause status: Secondary | ICD-10-CM | POA: Diagnosis not present

## 2016-09-19 DIAGNOSIS — X58XXXD Exposure to other specified factors, subsequent encounter: Secondary | ICD-10-CM | POA: Insufficient documentation

## 2016-09-19 DIAGNOSIS — S29012D Strain of muscle and tendon of back wall of thorax, subsequent encounter: Secondary | ICD-10-CM | POA: Diagnosis not present

## 2016-09-19 DIAGNOSIS — Z79899 Other long term (current) drug therapy: Secondary | ICD-10-CM | POA: Insufficient documentation

## 2016-09-19 DIAGNOSIS — S299XXD Unspecified injury of thorax, subsequent encounter: Secondary | ICD-10-CM | POA: Diagnosis present

## 2016-09-19 DIAGNOSIS — S29019D Strain of muscle and tendon of unspecified wall of thorax, subsequent encounter: Secondary | ICD-10-CM

## 2016-09-19 DIAGNOSIS — F909 Attention-deficit hyperactivity disorder, unspecified type: Secondary | ICD-10-CM | POA: Diagnosis not present

## 2016-09-19 DIAGNOSIS — Y929 Unspecified place or not applicable: Secondary | ICD-10-CM | POA: Diagnosis not present

## 2016-09-19 DIAGNOSIS — J45909 Unspecified asthma, uncomplicated: Secondary | ICD-10-CM | POA: Insufficient documentation

## 2016-09-19 DIAGNOSIS — Y9389 Activity, other specified: Secondary | ICD-10-CM | POA: Insufficient documentation

## 2016-09-19 MED ORDER — IBUPROFEN 800 MG PO TABS: 800.0000 mg | ORAL_TABLET | Freq: Three times a day (TID) | ORAL | 0 refills | Status: DC | PRN

## 2016-09-19 MED ORDER — METHOCARBAMOL 500 MG PO TABS
500.0000 mg | ORAL_TABLET | Freq: Once | ORAL | Status: AC
  Administered 2016-09-19: 500 mg via ORAL
  Filled 2016-09-19: qty 1

## 2016-09-19 MED ORDER — IBUPROFEN 800 MG PO TABS
800.0000 mg | ORAL_TABLET | Freq: Once | ORAL | Status: AC
  Administered 2016-09-19: 800 mg via ORAL
  Filled 2016-09-19: qty 1

## 2016-09-19 MED ORDER — METHOCARBAMOL 500 MG PO TABS: 500.0000 mg | ORAL_TABLET | Freq: Three times a day (TID) | ORAL | 0 refills | Status: DC | PRN

## 2016-09-19 NOTE — ED Triage Notes (Addendum)
Pt c/o upper middle back pain intermittently for 3 days.  He was seen on Tuesday night and states the pain keeps coming back despite medicating with ibuprofen.  He states the pain radiates through to his epigastric region and is worse with movement.  He has not scheduled a follow up with his primary doctor

## 2016-09-19 NOTE — ED Provider Notes (Signed)
TIME SEEN: 3:30 AM  CHIEF COMPLAINT: Thoracic back pain  HPI: Patient is a 31 year old male with history of asthma, anxiety who presents to the emergency department with thoracic back pain. Reports several days ago he was hanging curtains when he strained his thoracic region bilaterally. Was seen in the emergency department on 09/17/16 and was told to alternate Tylenol and ibuprofen. States he took ibuprofen 600 mg at 7 PM with only minimal relief. States pain has gotten worse with time. Feels like a tightness, throbbing. No radiation of pain. He denies any new injury. No numbness, tingling or focal weakness. No bowel or bladder incontinence. No fever. No history of epidural injections, back surgery. No history of IV drug abuse. No history of cancer. Has never had problems with his back before. Has not followed up with his primary care physician.  ROS: See HPI Constitutional: no fever  Eyes: no drainage  ENT: no runny nose   Cardiovascular:  no chest pain  Resp: no SOB  GI: no vomiting GU: no dysuria Integumentary: no rash  Allergy: no hives  Musculoskeletal: no leg swelling  Neurological: no slurred speech ROS otherwise negative  PAST MEDICAL HISTORY/PAST SURGICAL HISTORY:  Past Medical History:  Diagnosis Date  . ADHD (attention deficit hyperactivity disorder) 08/11/2011  . ALLERGIC RHINITIS 03/21/2007  . ALLERGY, HX OF 03/19/2007  . ANXIETY 03/21/2007  . ASTHMA 12/12/2008  . IBS 03/19/2007  . OBESITY 03/19/2007  . SINUSITIS- ACUTE-NOS 12/27/2009    MEDICATIONS:  Prior to Admission medications   Medication Sig Start Date End Date Taking? Authorizing Provider  clonazePAM (KLONOPIN) 1 MG tablet 1/2 - 1 tab by mouth twice per day as needed 11/25/13  Yes Corwin LevinsJames W John, MD  escitalopram (LEXAPRO) 20 MG tablet Take 1 tablet (20 mg total) by mouth daily. 02/21/13  Yes Corwin LevinsJames W John, MD    ALLERGIES:  Allergies  Allergen Reactions  . Diphenhydramine Hcl     SOCIAL HISTORY:  Social History   Substance Use Topics  . Smoking status: Never Smoker  . Smokeless tobacco: Never Used  . Alcohol use No    FAMILY HISTORY: Family History  Problem Relation Age of Onset  . Asthma Other     EXAM: BP 129/67 (BP Location: Left Arm)   Pulse (!) 52   Temp 98.1 F (36.7 C) (Oral)   Resp 18   Ht 5\' 10"  (1.778 m)   Wt 215 lb (97.5 kg)   SpO2 100%   BMI 30.85 kg/m  CONSTITUTIONAL: Alert and oriented and responds appropriately to questions. Well-appearing; well-nourished; GCS 15 HEAD: Normocephalic; atraumatic EYES: Conjunctivae clear, PERRL, EOMI ENT: normal nose; no rhinorrhea; moist mucous membranes; pharynx without lesions noted; no dental injury; no septal hematoma NECK: Supple, no meningismus, no LAD; no midline spinal tenderness, step-off or deformity; trachea midline CARD: RRR; S1 and S2 appreciated; no murmurs, no clicks, no rubs, no gallops RESP: Normal chest excursion without splinting or tachypnea; breath sounds clear and equal bilaterally; no wheezes, no rhonchi, no rales; no hypoxia or respiratory distress CHEST:  chest wall stable, no crepitus or ecchymosis or deformity, nontender to palpation; no flail chest ABD/GI: Normal bowel sounds; non-distended; soft, non-tender, no rebound, no guarding; no ecchymosis or other lesions noted PELVIS:  stable, nontender to palpation BACK:  The back appears normal and is tender to palpation over the thoracic paraspinal musculature bilaterally with associated muscle spasms but no erythema, warmth or other lesions noted, there is no CVA tenderness; no midline spinal  tenderness, step-off or deformity EXT: Normal ROM in all joints; non-tender to palpation; no edema; normal capillary refill; no cyanosis, no bony tenderness or bony deformity of patient's extremities, no joint effusion, compartments are soft, extremities are warm and well-perfused, no ecchymosis or lacerations    SKIN: Normal color for age and race; warm NEURO: Moves all  extremities equally, sensation to light touch intact diffusely, cranial nerves II through XII intact, strength 5/5 in all 4 extremities, no saddle anesthesia, 2+ deep reflexes in bilateral upper and lower extremities, normal gait PSYCH: The patient's mood and manner are appropriate. Grooming and personal hygiene are appropriate.  MEDICAL DECISION MAKING: Patient here with thoracic muscle strain. Have advised him to increase his ibuprofen 800 mg every 8 hours, use heat therapy, stretching. Will also discharge with Robaxin. No midline tenderness or direct trauma to suggest fracture. No neurologic deficit, fever to suggest epidural abscess or hematoma, discitis, transverse myelitis, spinal stenosis, cauda equina. I do not feel he needs emergent back imaging. Recommended close follow up with his PCP if symptoms continue. He is comfortable with this plan. Father is here to drive him home. Given dose of ibuprofen and Robaxin in the emergency department.  At this time, I do not feel there is any life-threatening condition present. I have reviewed and discussed all results (EKG, imaging, lab, urine as appropriate) and exam findings with patient/family. I have reviewed nursing notes and appropriate previous records.  I feel the patient is safe to be discharged home without further emergent workup and can continue workup as an outpatient as needed. Discussed usual and customary return precautions. Patient/family verbalize understanding and are comfortable with this plan.  Outpatient follow-up has been provided. All questions have been answered.       Layla Maw Phyllistine Domingos, DO 09/19/16 [phone]

## 2016-09-28 ENCOUNTER — Observation Stay (HOSPITAL_BASED_OUTPATIENT_CLINIC_OR_DEPARTMENT_OTHER)
Admission: EM | Admit: 2016-09-28 | Discharge: 2016-10-01 | Disposition: A | Discharge status: Home / Self Care | Payer: BLUE CROSS/BLUE SHIELD | Attending: Surgery | Admitting: Surgery

## 2016-09-28 ENCOUNTER — Encounter (HOSPITAL_BASED_OUTPATIENT_CLINIC_OR_DEPARTMENT_OTHER): Payer: Self-pay | Admitting: Emergency Medicine

## 2016-09-28 DIAGNOSIS — Z419 Encounter for procedure for purposes other than remedying health state, unspecified: Secondary | ICD-10-CM

## 2016-09-28 DIAGNOSIS — Z6830 Body mass index (BMI) 30.0-30.9, adult: Secondary | ICD-10-CM | POA: Insufficient documentation

## 2016-09-28 DIAGNOSIS — K219 Gastro-esophageal reflux disease without esophagitis: Secondary | ICD-10-CM | POA: Diagnosis not present

## 2016-09-28 DIAGNOSIS — F419 Anxiety disorder, unspecified: Secondary | ICD-10-CM | POA: Insufficient documentation

## 2016-09-28 DIAGNOSIS — K8010 Calculus of gallbladder with chronic cholecystitis without obstruction: Secondary | ICD-10-CM | POA: Diagnosis not present

## 2016-09-28 DIAGNOSIS — E669 Obesity, unspecified: Secondary | ICD-10-CM | POA: Diagnosis not present

## 2016-09-28 DIAGNOSIS — R1011 Right upper quadrant pain: Secondary | ICD-10-CM

## 2016-09-28 DIAGNOSIS — Z888 Allergy status to other drugs, medicaments and biological substances status: Secondary | ICD-10-CM | POA: Insufficient documentation

## 2016-09-28 DIAGNOSIS — G4700 Insomnia, unspecified: Secondary | ICD-10-CM | POA: Diagnosis not present

## 2016-09-28 DIAGNOSIS — K810 Acute cholecystitis: Secondary | ICD-10-CM | POA: Diagnosis present

## 2016-09-28 DIAGNOSIS — K589 Irritable bowel syndrome without diarrhea: Secondary | ICD-10-CM | POA: Diagnosis not present

## 2016-09-28 DIAGNOSIS — K819 Cholecystitis, unspecified: Secondary | ICD-10-CM

## 2016-09-28 DIAGNOSIS — J45909 Unspecified asthma, uncomplicated: Secondary | ICD-10-CM | POA: Diagnosis not present

## 2016-09-28 LAB — CBC
HEMATOCRIT: 43.5 % (ref 39.0–52.0)
Hemoglobin: 15.7 g/dL (ref 13.0–17.0)
MCH: 33.4 pg (ref 26.0–34.0)
MCHC: 36.1 g/dL — AB (ref 30.0–36.0)
MCV: 92.6 fL (ref 78.0–100.0)
Platelets: 299 10*3/uL (ref 150–400)
RBC: 4.7 MIL/uL (ref 4.22–5.81)
RDW: 11.9 % (ref 11.5–15.5)
WBC: 14.9 10*3/uL — ABNORMAL HIGH (ref 4.0–10.5)

## 2016-09-28 LAB — COMPREHENSIVE METABOLIC PANEL
ALBUMIN: 4.2 g/dL (ref 3.5–5.0)
ALK PHOS: 97 U/L (ref 38–126)
ALT: 286 U/L — ABNORMAL HIGH (ref 17–63)
AST: 382 U/L — AB (ref 15–41)
Anion gap: 7 (ref 5–15)
BILIRUBIN TOTAL: 1.3 mg/dL — AB (ref 0.3–1.2)
BUN: 17 mg/dL (ref 6–20)
CO2: 26 mmol/L (ref 22–32)
Calcium: 9.1 mg/dL (ref 8.9–10.3)
Chloride: 106 mmol/L (ref 101–111)
Creatinine, Ser: 1.27 mg/dL — ABNORMAL HIGH (ref 0.61–1.24)
GFR calc Af Amer: >60 mL/min (ref >60)
GFR calc non Af Amer: >60 mL/min (ref >60)
GLUCOSE: 132 mg/dL — AB (ref 65–99)
POTASSIUM: 3.8 mmol/L (ref 3.5–5.1)
Sodium: 139 mmol/L (ref 135–145)
TOTAL PROTEIN: 7.4 g/dL (ref 6.5–8.1)

## 2016-09-28 LAB — URINALYSIS, ROUTINE W REFLEX MICROSCOPIC
Glucose, UA: NEGATIVE mg/dL
HGB URINE DIPSTICK: NEGATIVE
KETONES UR: NEGATIVE mg/dL
Leukocytes, UA: NEGATIVE
NITRITE: NEGATIVE
PH: 5.5 (ref 5.0–8.0)
Protein, ur: 30 mg/dL — AB
Specific Gravity, Urine: 1.026 (ref 1.005–1.030)

## 2016-09-28 LAB — URINALYSIS, MICROSCOPIC (REFLEX)

## 2016-09-28 LAB — LIPASE, BLOOD: Lipase: 34 U/L (ref 11–51)

## 2016-09-28 NOTE — ED Provider Notes (Signed)
[facility] Provider Note   CSN: [id number] Arrival date & time: 09/28/16  2108   By signing my name below, I, Soijett Blue, attest that this documentation has been prepared under the direction and in the presence of Wilburn Mylar, PA-C Electronically Signed: Soijett Blue, ED Scribe. 09/28/16. 11:10 PM.  History   Chief Complaint Chief Complaint  Patient presents with  . Abdominal Pain    HPI Maxwell Herrera is a 31 y.o. male with a PMHx of IBS, who presents to the Emergency Department complaining of gradually worsening, intermittent, cramping, periumbilical and RUQ abdominal pain onset 2 days ago. Pt reports associated nausea, emesis x 2 episodes, chills, irregular bowel movements, mid-to-upper back pain x 2 weeks, and mildly resolved SOB with pain. Pt has tried pep-to bismol, robaxin, and ibuprofen with no relief of his symptoms. Pt abdominal pain is worsened with laying down and alleviated with sitting up. He notes that he has not had similar symptoms in the past. He states that he pulled a muscle in his back a couple months ago with no recent injury. Pt back pain is worsened with prolonged standing and alleviated with laying with his arm above his head. Has been seen in the ED and at [facility] for same and given muscle relaxer and NSAIDs. Denies Fever, chills, chest pain, urinary symptoms dysuria, diarrhea, blood in stool, and any other symptoms. Denies any loss of bowel or bladder, saddle paresthesias, urinary retention, lower show any paresthesias area and denies any history of IV drug use, recent epidural injections, history of cancer. Pt states that he has a hx of GERD that he self treats with TUMS.     The history is provided by the patient. No language interpreter was used.    Past Medical History:  Diagnosis Date  . ADHD (attention deficit hyperactivity disorder) 08/11/2011  . ALLERGIC RHINITIS 03/21/2007  . ALLERGY, HX OF 03/19/2007  . ANXIETY 03/21/2007  . ASTHMA  12/12/2008  . IBS 03/19/2007  . OBESITY 03/19/2007  . SINUSITIS- ACUTE-NOS 12/27/2009    Patient Active Problem List   Diagnosis Date Noted  . Acute sinus infection 12/07/2013  . Insomnia 05/24/2013  . ADHD (attention deficit hyperactivity disorder) 08/11/2011  . Preventative health care 06/19/2011  . ASTHMA 12/12/2008  . ANXIETY 03/21/2007  . ALLERGIC RHINITIS 03/21/2007  . OBESITY 03/19/2007  . IBS 03/19/2007    Past Surgical History:  Procedure Laterality Date  . STOMACH SURGERY     s/p as toddler for malposition  . TONSILLECTOMY         Home Medications    Prior to Admission medications   Medication Sig Start Date End Date Taking? Authorizing Provider  clonazePAM (KLONOPIN) 1 MG tablet 1/2 - 1 tab by mouth twice per day as needed 11/25/13   Corwin Levins, MD  escitalopram (LEXAPRO) 20 MG tablet Take 1 tablet (20 mg total) by mouth daily. 02/21/13   Corwin Levins, MD  ibuprofen (ADVIL,MOTRIN) 800 MG tablet Take 1 tablet (800 mg total) by mouth every 8 (eight) hours as needed for mild pain. 09/19/16   Kristen N Ward, DO  methocarbamol (ROBAXIN) 500 MG tablet Take 1 tablet (500 mg total) by mouth every 8 (eight) hours as needed for muscle spasms. 09/19/16   Layla Maw Ward, DO    Family History Family History  Problem Relation Age of Onset  . Asthma Other     Social History Social History  Substance Use Topics  . Smoking  status: Never Smoker  . Smokeless tobacco: Never Used  . Alcohol use No     Allergies   Diphenhydramine hcl   Review of Systems Review of Systems  Constitutional: Positive for chills. Negative for fever.  Respiratory: Positive for shortness of breath.   Cardiovascular: Negative for chest pain, palpitations and leg swelling.  Gastrointestinal: Positive for abdominal pain, nausea and vomiting. Negative for blood in stool and diarrhea.  Genitourinary: Negative for dysuria, hematuria and urgency.  Musculoskeletal: Positive for back pain.    Neurological: Negative for dizziness, syncope, weakness, light-headedness and numbness.     Physical Exam Updated Vital Signs BP 142/64 (BP Location: Left Arm)   Pulse (!) 55   Temp 98.1 F (36.7 C) (Oral)   Resp 18   Ht 5\' 10"  (1.778 m)   Wt 215 lb (97.5 kg)   SpO2 99%   BMI 30.85 kg/m   Physical Exam  Constitutional: He is oriented to person, place, and time. He appears well-developed and well-nourished. No distress.  Patient is nontoxic appearing. Resting on the bed comfortably.  HENT:  Head: Normocephalic and atraumatic.  Eyes: Conjunctivae and EOM are normal. Pupils are equal, round, and reactive to light.  Neck: Normal range of motion. Neck supple.  Cardiovascular: Regular rhythm, normal heart sounds and intact distal pulses.  Bradycardia present.   Pulmonary/Chest: Effort normal and breath sounds normal. No respiratory distress. He has no wheezes. He has no rales. He exhibits no tenderness.  Abdominal: Soft. Bowel sounds are normal. He exhibits no distension. There is no hepatosplenomegaly. There is tenderness in the right upper quadrant and epigastric area. There is positive Murphy's sign. There is no rigidity, no rebound, no guarding, no CVA tenderness and no tenderness at McBurney's point.  Musculoskeletal: Normal range of motion.  The back appears normal and is tender to palpation over the thoracic paraspinal musculature bilaterally with associated muscle spasms but no erythema, warmth or other lesions noted, there is no CVA tenderness; no midline spinal tenderness, step-off or deformity  Lymphadenopathy:    He has no cervical adenopathy.  Neurological: He is alert and oriented to person, place, and time.  Moves all extremities equally, sensation to light touch intact diffusely, cranial nerves II through XII intact, strength 5/5 in all 4 extremities, no saddle anesthesia, 2+ deep reflexes in bilateral upper and lower extremities, normal gait  Skin: Skin is warm and dry.  Capillary refill takes less than 2 seconds.  Psychiatric: He has a normal mood and affect. His behavior is normal.  Nursing note and vitals reviewed.    ED Treatments / Results  DIAGNOSTIC STUDIES: Oxygen Saturation is 99% on RA, nl by my interpretation.    COORDINATION OF CARE: 11:08 PM Discussed treatment plan with pt at bedside which includes labs, UA, CXR, EKG, and pt agreed to plan.   Labs (all labs ordered are listed, but only abnormal results are displayed) Labs Reviewed  COMPREHENSIVE METABOLIC PANEL - Abnormal; Notable for the following:       Result Value   Glucose, Bld 132 (*)    Creatinine, Ser 1.27 (*)    AST 382 (*)    ALT 286 (*)    Total Bilirubin 1.3 (*)    All other components within normal limits  CBC - Abnormal; Notable for the following:    WBC 14.9 (*)    MCHC 36.1 (*)    All other components within normal limits  URINALYSIS, ROUTINE W REFLEX MICROSCOPIC - Abnormal; Notable for  the following:    Color, Urine AMBER (*)    Bilirubin Urine SMALL (*)    Protein, ur 30 (*)    All other components within normal limits  URINALYSIS, MICROSCOPIC (REFLEX) - Abnormal; Notable for the following:    Bacteria, UA FEW (*)    Squamous Epithelial / LPF 0-5 (*)    All other components within normal limits  LIPASE, BLOOD  TROPONIN I    EKG  EKG Interpretation  Date/Time:  Sunday September 28 2016 21:24:08 EST Ventricular Rate:  50 PR Interval:  138 QRS Duration: 94 QT Interval:  440 QTC Calculation: 401 R Axis:   24 Text Interpretation:  Sinus bradycardia Septal infarct , age undetermined Abnormal ECG No old tracing to compare Confirmed by WARD,  DO, KRISTEN ([id number]) on 09/28/2016 11:52:21 PM       Radiology No results found.  Procedures Procedures (including critical care time)  Medications Ordered in ED Medications - No data to display   Initial Impression / Assessment and Plan / ED Course  I have reviewed the triage vital signs and the nursing  notes.  Pertinent labs & imaging results that were available during my care of the patient were reviewed by me and considered in my medical decision making (see chart for details).     The patient presents to the ED with right upper quadrant abdominal pain, emesis and thoracic back pain. Patient with positive Murphy's sign. Patient is bradycardic however review of past visits patient with bradycardia. Patient is afebrile. Normotensive. Patient with a white count of 14,000. AST and ALT are elevated. Total bilirubin is elevated. Urine with bilirubin no signs of infection. Creatinine 1.2. All the labs unremarkable. EKG shows sinus bradycardia no prior tracing. Troponin was negative. Patient likely short of breath due to right upper quadrant pain. Patient denies any chest pain. Doubt PE or ACSPatient needs abdominal ultrasound however unable to obtain at [facility] for likely cholecystitis. Will be transferred to [facility] ED for ultrasound. Patient complains of thoracic back pain. No red flag symptoms. Moving makes the pain worse. Has been seen in the ED for same with normal imagine. Seen at [facility] today for same. Discharged with muscle relaxer and NSAIDs. Needs to follow-up with primary care doctor for further management with orthopedist. Patient is currently hemodynamically stable. Spoke with PA- Lawyer at [facility] ED with Dr. Wilkie Aye as the accepting physician. Disposition pending [place] results and pain management and vs remain stable. Pt was seen and examined by Dr. Elesa Massed who is agreeable to the above plan. Pt transferred POV with IV in place. Feel patient is safe for transport.  Final Clinical Impressions(s) / ED Diagnoses   Final diagnoses:  RUQ pain    New Prescriptions New Prescriptions   No medications on file   I personally performed the services described in this documentation, which was scribed in my presence. The recorded information has been reviewed and is accurate.     Rise Mu,  PA-C 09/29/16 0157    Layla Maw Ward, DO 09/29/16 1610

## 2016-09-28 NOTE — ED Notes (Signed)
EKG given to Dr Pickering 

## 2016-09-28 NOTE — ED Triage Notes (Signed)
Pt reports chills, generalized abd pain, sob,mid upper back pain under shoulder blades x2 weeks.  Denies cp. Pt has had 2 episodes of emesis today, no diarrhea.  Pt alert and oriented at this time.

## 2016-09-28 NOTE — ED Notes (Signed)
Pt reports mid back pain for 2 weeks. And abdominal pain that started last night with nausea but no vomiting and no diarrhea. Last BM today but "kinda hard to get out"...

## 2016-09-29 ENCOUNTER — Observation Stay (HOSPITAL_COMMUNITY): Payer: BLUE CROSS/BLUE SHIELD | Admitting: Anesthesiology

## 2016-09-29 ENCOUNTER — Emergency Department (HOSPITAL_COMMUNITY): Payer: BLUE CROSS/BLUE SHIELD

## 2016-09-29 ENCOUNTER — Observation Stay (HOSPITAL_COMMUNITY): Payer: BLUE CROSS/BLUE SHIELD

## 2016-09-29 ENCOUNTER — Encounter (HOSPITAL_COMMUNITY)
Admission: EM | Disposition: A | Discharge status: Home / Self Care | Payer: Self-pay | Source: Home / Self Care | Attending: Emergency Medicine

## 2016-09-29 DIAGNOSIS — K810 Acute cholecystitis: Secondary | ICD-10-CM | POA: Diagnosis present

## 2016-09-29 HISTORY — PX: CHOLECYSTECTOMY: SHX55

## 2016-09-29 LAB — SURGICAL PCR SCREEN
MRSA, PCR: NEGATIVE
STAPHYLOCOCCUS AUREUS: NEGATIVE

## 2016-09-29 LAB — TROPONIN I: Troponin I: <0.03 ng/mL (ref <0.03)

## 2016-09-29 LAB — HIV ANTIBODY (ROUTINE TESTING W REFLEX): HIV Screen 4th Generation wRfx: NONREACTIVE

## 2016-09-29 SURGERY — LAPAROSCOPIC CHOLECYSTECTOMY WITH INTRAOPERATIVE CHOLANGIOGRAM
Anesthesia: General | Site: Abdomen

## 2016-09-29 MED ORDER — DIPHENHYDRAMINE HCL 50 MG/ML IJ SOLN: 25.0000 mg | Freq: Four times a day (QID) | INTRAMUSCULAR | Status: DC | PRN

## 2016-09-29 MED ORDER — LACTATED RINGERS IV SOLN
INTRAVENOUS | Status: DC
  Administered 2016-09-29 (×2): via INTRAVENOUS

## 2016-09-29 MED ORDER — SUGAMMADEX SODIUM 200 MG/2ML IV SOLN
INTRAVENOUS | Status: DC | PRN
  Administered 2016-09-29: 200 mg via INTRAVENOUS

## 2016-09-29 MED ORDER — ONDANSETRON HCL 4 MG/2ML IJ SOLN
4.0000 mg | Freq: Four times a day (QID) | INTRAMUSCULAR | Status: DC | PRN
  Administered 2016-09-30: 4 mg via INTRAVENOUS
  Filled 2016-09-29: qty 2

## 2016-09-29 MED ORDER — PROMETHAZINE HCL 25 MG/ML IJ SOLN: 6.2500 mg | INTRAMUSCULAR | Status: DC | PRN

## 2016-09-29 MED ORDER — ACETAMINOPHEN 650 MG RE SUPP: 650.0000 mg | Freq: Four times a day (QID) | RECTAL | Status: DC | PRN

## 2016-09-29 MED ORDER — MORPHINE SULFATE (PF) 4 MG/ML IV SOLN
4.0000 mg | Freq: Once | INTRAVENOUS | Status: AC
  Administered 2016-09-29: 4 mg via INTRAVENOUS
  Filled 2016-09-29: qty 1

## 2016-09-29 MED ORDER — ONDANSETRON 4 MG PO TBDP: 4.0000 mg | ORAL_TABLET | Freq: Four times a day (QID) | ORAL | Status: DC | PRN

## 2016-09-29 MED ORDER — ROCURONIUM BROMIDE 100 MG/10ML IV SOLN
INTRAVENOUS | Status: DC | PRN
  Administered 2016-09-29: 15 mg via INTRAVENOUS
  Administered 2016-09-29: 20 mg via INTRAVENOUS
  Administered 2016-09-29: 10 mg via INTRAVENOUS
  Administered 2016-09-29: 25 mg via INTRAVENOUS

## 2016-09-29 MED ORDER — HYDROCODONE-ACETAMINOPHEN 7.5-325 MG PO TABS: 1.0000 | ORAL_TABLET | Freq: Once | ORAL | Status: DC | PRN

## 2016-09-29 MED ORDER — DIPHENHYDRAMINE HCL 25 MG PO CAPS: 25.0000 mg | ORAL_CAPSULE | Freq: Four times a day (QID) | ORAL | Status: DC | PRN

## 2016-09-29 MED ORDER — HYDROCODONE-ACETAMINOPHEN 5-325 MG PO TABS
1.0000 | ORAL_TABLET | ORAL | Status: DC | PRN
  Administered 2016-09-29 – 2016-10-01 (×8): 2 via ORAL
  Filled 2016-09-29 (×8): qty 2

## 2016-09-29 MED ORDER — MIDAZOLAM HCL 5 MG/5ML IJ SOLN
INTRAMUSCULAR | Status: DC | PRN
  Administered 2016-09-29: 2 mg via INTRAVENOUS

## 2016-09-29 MED ORDER — HYDROMORPHONE HCL 1 MG/ML IJ SOLN
0.2500 mg | INTRAMUSCULAR | Status: DC | PRN
  Administered 2016-09-29: 0.5 mg via INTRAVENOUS

## 2016-09-29 MED ORDER — ACETAMINOPHEN 325 MG PO TABS: 650.0000 mg | ORAL_TABLET | Freq: Four times a day (QID) | ORAL | Status: DC | PRN

## 2016-09-29 MED ORDER — 0.9 % SODIUM CHLORIDE (POUR BTL) OPTIME
TOPICAL | Status: DC | PRN
Start: 2016-09-29 — End: 2016-09-29
  Administered 2016-09-29: 1000 mL

## 2016-09-29 MED ORDER — IOPAMIDOL (ISOVUE-300) INJECTION 61%
INTRAVENOUS | Status: AC
  Filled 2016-09-29: qty 50

## 2016-09-29 MED ORDER — BUPIVACAINE-EPINEPHRINE (PF) 0.5% -1:200000 IJ SOLN
INTRAMUSCULAR | Status: DC | PRN
  Administered 2016-09-29: 20 mL

## 2016-09-29 MED ORDER — MIDAZOLAM HCL 2 MG/2ML IJ SOLN
INTRAMUSCULAR | Status: AC
  Filled 2016-09-29: qty 2

## 2016-09-29 MED ORDER — FENTANYL CITRATE (PF) 100 MCG/2ML IJ SOLN
INTRAMUSCULAR | Status: AC
  Filled 2016-09-29: qty 2

## 2016-09-29 MED ORDER — MORPHINE SULFATE (PF) 2 MG/ML IV SOLN
1.0000 mg | INTRAVENOUS | Status: DC | PRN
  Administered 2016-09-29 (×2): 2 mg via INTRAVENOUS
  Filled 2016-09-29 (×2): qty 1

## 2016-09-29 MED ORDER — SODIUM CHLORIDE 0.9 % IR SOLN
Status: DC | PRN
  Administered 2016-09-29: 1000 mL

## 2016-09-29 MED ORDER — LIDOCAINE HCL (CARDIAC) 20 MG/ML IV SOLN
INTRAVENOUS | Status: DC | PRN
  Administered 2016-09-29: 80 mg via INTRATRACHEAL

## 2016-09-29 MED ORDER — BUPIVACAINE-EPINEPHRINE (PF) 0.5% -1:200000 IJ SOLN
INTRAMUSCULAR | Status: AC
  Filled 2016-09-29: qty 30

## 2016-09-29 MED ORDER — HEMOSTATIC AGENTS (NO CHARGE) OPTIME
TOPICAL | Status: DC | PRN
Start: 2016-09-29 — End: 2016-09-29
  Administered 2016-09-29: 1 via TOPICAL

## 2016-09-29 MED ORDER — KCL IN DEXTROSE-NACL 20-5-0.45 MEQ/L-%-% IV SOLN
INTRAVENOUS | Status: DC
  Administered 2016-09-29 – 2016-09-30 (×3): via INTRAVENOUS
  Filled 2016-09-29 (×2): qty 1000

## 2016-09-29 MED ORDER — MEPERIDINE HCL 25 MG/ML IJ SOLN: 6.2500 mg | INTRAMUSCULAR | Status: DC | PRN

## 2016-09-29 MED ORDER — ONDANSETRON HCL 4 MG/2ML IJ SOLN
INTRAMUSCULAR | Status: DC | PRN
  Administered 2016-09-29: 4 mg via INTRAVENOUS

## 2016-09-29 MED ORDER — ONDANSETRON HCL 4 MG/2ML IJ SOLN
4.0000 mg | Freq: Once | INTRAMUSCULAR | Status: AC
  Administered 2016-09-29: 4 mg via INTRAVENOUS
  Filled 2016-09-29: qty 2

## 2016-09-29 MED ORDER — PROPOFOL 10 MG/ML IV BOLUS
INTRAVENOUS | Status: DC | PRN
  Administered 2016-09-29: 180 mg via INTRAVENOUS

## 2016-09-29 MED ORDER — FENTANYL CITRATE (PF) 100 MCG/2ML IJ SOLN
INTRAMUSCULAR | Status: DC | PRN
  Administered 2016-09-29 (×2): 100 ug via INTRAVENOUS
  Administered 2016-09-29 (×2): 50 ug via INTRAVENOUS

## 2016-09-29 MED ORDER — MORPHINE SULFATE (PF) 2 MG/ML IV SOLN
2.0000 mg | INTRAVENOUS | Status: DC | PRN
  Administered 2016-09-29 (×2): 2 mg via INTRAVENOUS
  Filled 2016-09-29 (×2): qty 1

## 2016-09-29 MED ORDER — PIPERACILLIN-TAZOBACTAM 3.375 G IVPB 30 MIN
3.3750 g | Freq: Once | INTRAVENOUS | Status: AC
  Administered 2016-09-29: 3.375 g via INTRAVENOUS
  Filled 2016-09-29: qty 50

## 2016-09-29 MED ORDER — PROPOFOL 10 MG/ML IV BOLUS
INTRAVENOUS | Status: AC
  Filled 2016-09-29: qty 20

## 2016-09-29 MED ORDER — HYDROMORPHONE HCL 1 MG/ML IJ SOLN
INTRAMUSCULAR | Status: AC
  Filled 2016-09-29: qty 0.5

## 2016-09-29 MED ORDER — KCL IN DEXTROSE-NACL 20-5-0.45 MEQ/L-%-% IV SOLN
INTRAVENOUS | Status: AC
  Filled 2016-09-29: qty 1000

## 2016-09-29 MED ORDER — CEFTRIAXONE SODIUM 2 G IJ SOLR
2.0000 g | INTRAMUSCULAR | Status: DC
  Administered 2016-09-29 – 2016-09-30 (×2): 2 g via INTRAVENOUS
  Filled 2016-09-29 (×4): qty 2

## 2016-09-29 MED ORDER — IOPAMIDOL (ISOVUE-300) INJECTION 61%
INTRAVENOUS | Status: DC | PRN
  Administered 2016-09-29: 10 mL

## 2016-09-29 MED ORDER — ESCITALOPRAM OXALATE 20 MG PO TABS
20.0000 mg | ORAL_TABLET | Freq: Every day | ORAL | Status: DC
  Administered 2016-09-30 – 2016-10-01 (×2): 20 mg via ORAL
  Filled 2016-09-29 (×2): qty 1

## 2016-09-29 MED ORDER — SUCCINYLCHOLINE CHLORIDE 20 MG/ML IJ SOLN
INTRAMUSCULAR | Status: DC | PRN
  Administered 2016-09-29: 120 mg via INTRAVENOUS

## 2016-09-29 SURGICAL SUPPLY — 48 items
ADH SKN CLS APL DERMABOND .7 (GAUZE/BANDAGES/DRESSINGS) ×1
APPLIER CLIP 5 13 M/L LIGAMAX5 (MISCELLANEOUS) ×6
APR CLP MED LRG 5 ANG JAW (MISCELLANEOUS) ×3
BAG SPEC RTRVL 10 TROC 200 (ENDOMECHANICALS) ×1
BAG SPEC RTRVL LRG 6X4 10 (ENDOMECHANICALS) ×1
BLADE SURG CLIPPER 3M 9600 (MISCELLANEOUS) IMPLANT
CANISTER SUCT 3000ML PPV (MISCELLANEOUS) ×2 IMPLANT
CHLORAPREP W/TINT 26ML (MISCELLANEOUS) ×2 IMPLANT
CLIP APPLIE 5 13 M/L LIGAMAX5 (MISCELLANEOUS) ×1 IMPLANT
COVER MAYO STAND STRL (DRAPES) ×2 IMPLANT
COVER SURGICAL LIGHT HANDLE (MISCELLANEOUS) ×2 IMPLANT
DERMABOND ADVANCED (GAUZE/BANDAGES/DRESSINGS) ×1
DERMABOND ADVANCED .7 DNX12 (GAUZE/BANDAGES/DRESSINGS) ×1 IMPLANT
DRAPE C-ARM 42X72 X-RAY (DRAPES) ×2 IMPLANT
ELECT REM PT RETURN 9FT ADLT (ELECTROSURGICAL) ×2
ELECTRODE REM PT RTRN 9FT ADLT (ELECTROSURGICAL) ×1 IMPLANT
GLOVE BIO SURGEON STRL SZ7 (GLOVE) ×1 IMPLANT
GLOVE BIOGEL PI IND STRL 7.0 (GLOVE) IMPLANT
GLOVE BIOGEL PI IND STRL 8 (GLOVE) IMPLANT
GLOVE BIOGEL PI INDICATOR 7.0 (GLOVE) ×3
GLOVE BIOGEL PI INDICATOR 8 (GLOVE) ×1
GLOVE ECLIPSE 7.5 STRL STRAW (GLOVE) ×1 IMPLANT
GLOVE SURG SIGNA 7.5 PF LTX (GLOVE) ×2 IMPLANT
GOWN STRL REUS W/ TWL LRG LVL3 (GOWN DISPOSABLE) ×2 IMPLANT
GOWN STRL REUS W/ TWL XL LVL3 (GOWN DISPOSABLE) ×1 IMPLANT
GOWN STRL REUS W/TWL LRG LVL3 (GOWN DISPOSABLE) ×8
GOWN STRL REUS W/TWL XL LVL3 (GOWN DISPOSABLE) ×2
HEMOSTAT SNOW SURGICEL 2X4 (HEMOSTASIS) ×1 IMPLANT
KIT BASIN OR (CUSTOM PROCEDURE TRAY) ×2 IMPLANT
KIT ROOM TURNOVER OR (KITS) ×2 IMPLANT
NS IRRIG 1000ML POUR BTL (IV SOLUTION) ×2 IMPLANT
PAD ARMBOARD 7.5X6 YLW CONV (MISCELLANEOUS) ×2 IMPLANT
POUCH RETRIEVAL ECOSAC 10 (ENDOMECHANICALS) IMPLANT
POUCH RETRIEVAL ECOSAC 10MM (ENDOMECHANICALS) ×1
POUCH SPECIMEN RETRIEVAL 10MM (ENDOMECHANICALS) ×2 IMPLANT
SCISSORS LAP 5X35 DISP (ENDOMECHANICALS) ×2 IMPLANT
SET CHOLANGIOGRAPH 5 50 .035 (SET/KITS/TRAYS/PACK) ×2 IMPLANT
SET IRRIG TUBING LAPAROSCOPIC (IRRIGATION / IRRIGATOR) ×2 IMPLANT
SLEEVE ENDOPATH XCEL 5M (ENDOMECHANICALS) ×4 IMPLANT
SPECIMEN JAR SMALL (MISCELLANEOUS) ×2 IMPLANT
SUT MNCRL AB 4-0 PS2 18 (SUTURE) ×2 IMPLANT
SUT VICRYL 0 UR6 27IN ABS (SUTURE) ×2 IMPLANT
TOWEL OR 17X24 6PK STRL BLUE (TOWEL DISPOSABLE) ×2 IMPLANT
TOWEL OR 17X26 10 PK STRL BLUE (TOWEL DISPOSABLE) ×2 IMPLANT
TRAY LAPAROSCOPIC MC (CUSTOM PROCEDURE TRAY) ×2 IMPLANT
TROCAR XCEL BLUNT TIP 100MML (ENDOMECHANICALS) ×2 IMPLANT
TROCAR XCEL NON-BLD 5MMX100MML (ENDOMECHANICALS) ×2 IMPLANT
TUBING INSUFFLATION (TUBING) ×2 IMPLANT

## 2016-09-29 NOTE — Anesthesia Procedure Notes (Addendum)
Procedure Name: Intubation Date/Time: 09/29/2016 11:05 AM Performed by: Samara DeistBECKNER, Sunni Richardson B Pre-anesthesia Checklist: Patient identified, Emergency Drugs available, Suction available and Patient being monitored Patient Re-evaluated:Patient Re-evaluated prior to inductionOxygen Delivery Method: Circle System Utilized Preoxygenation: Pre-oxygenation with 100% oxygen Intubation Type: IV induction Ventilation: Mask ventilation without difficulty Laryngoscope Size: Miller and 2 Grade View: Grade I Tube type: Oral Tube size: 7.5 mm Number of attempts: 1 Airway Equipment and Method: Stylet and Oral airway Placement Confirmation: ETT inserted through vocal cords under direct vision,  positive ETCO2 and breath sounds checked- equal and bilateral Secured at: 21 cm Tube secured with: Tape Dental Injury: Teeth and Oropharynx as per pre-operative assessment  Comments: DL by Prudencio PairViolet Bogoevski, SRNA

## 2016-09-29 NOTE — ED Notes (Signed)
Surgeon at bedside.  

## 2016-09-29 NOTE — ED Notes (Signed)
Pt arrives as transfer via POV. Pt a/o x 4, ambulatory. Pt brady in the 40's. Pt denies any lightheadedness, dizziness, SOB or chest pain/tightness.

## 2016-09-29 NOTE — ED Provider Notes (Signed)
Patient seen as a transfer from [facility]. Requesting ultrasound for evaluation of cholecystitis. Patient with elevated LFTs and white count of 15. His otherwise nontoxic. Afebrile. Reports 2 week history of symptoms. Is given pain and nausea medication. Ultrasound confirms likely cholecystitis. Patient was given Zosyn given his white count and duration of symptoms. Discussed with Dr. Francena HanlyKisinger who will evaluate the patient.   Shon Batonourtney F Horton, MD 09/29/16 [phone]

## 2016-09-29 NOTE — Progress Notes (Signed)
Patient ID: Yehuda MaoDaniel R Soots, male   DOB: 05-Jan-1986, 31 y.o.   MRN: [id number]  Patient with acute cholecystitis and cholelithiasis Plan lap chole with c-gram today. I discussed the procedure in detail.   We discussed the risks and benefits of a laparoscopic cholecystectomy and  cholangiogram including, but not limited to bleeding, infection, injury to surrounding structures such as the intestine or liver, bile leak, retained gallstones, need to convert to an open procedure, prolonged diarrhea, blood clots such as  DVT, common bile duct injury, anesthesia risks, and possible need for additional procedures.  The likelihood of improvement in symptoms and return to the patient's normal status is good. We discussed the typical post-operative recovery course.

## 2016-09-29 NOTE — Progress Notes (Signed)
Patient arrived back from PACU alert and oriented, slightly drowsy, mild pain, IV fluids infusing, 4 lap sites noted with skin glue, family at bedside. Will continue to monitor.

## 2016-09-29 NOTE — Anesthesia Preprocedure Evaluation (Addendum)
Anesthesia Evaluation  Patient identified by MRN, date of birth, ID band Patient awake    Reviewed: Allergy & Precautions, NPO status , Patient's Chart, lab work & pertinent test results  Airway Mallampati: III  TM Distance: >3 FB     Dental no notable dental hx. (+) Teeth Intact   Pulmonary neg pulmonary ROS,    Pulmonary exam normal breath sounds clear to auscultation       Cardiovascular negative cardio ROS Normal cardiovascular exam Rhythm:Regular Rate:Normal     Neuro/Psych PSYCHIATRIC DISORDERS Anxiety ADHDnegative neurological ROS     GI/Hepatic Neg liver ROS, Cholelithiasis w/ acute cholecystitis   Endo/Other  Obesity  Renal/GU negative Renal ROS     Musculoskeletal negative musculoskeletal ROS (+)   Abdominal   Peds  Hematology negative hematology ROS (+)   Anesthesia Other Findings   Reproductive/Obstetrics                            Anesthesia Physical Anesthesia Plan  ASA: II  Anesthesia Plan: General   Post-op Pain Management:    Induction: Intravenous, Rapid sequence and Cricoid pressure planned  Airway Management Planned: Oral ETT  Additional Equipment:   Intra-op Plan:   Post-operative Plan: Extubation in OR  Informed Consent: I have reviewed the patients History and Physical, chart, labs and discussed the procedure including the risks, benefits and alternatives for the proposed anesthesia with the patient or authorized representative who has indicated his/her understanding and acceptance.   Dental advisory given  Plan Discussed with: Anesthesiologist, Surgeon and CRNA  Anesthesia Plan Comments:         Anesthesia Quick Evaluation

## 2016-09-29 NOTE — Transfer of Care (Signed)
Immediate Anesthesia Transfer of Care Note  Patient: Maxwell Herrera  Procedure(s) Performed: Procedure(s): LAPAROSCOPIC CHOLECYSTECTOMY WITH INTRAOPERATIVE CHOLANGIOGRAM (N/A)  Patient Location: PACU  Anesthesia Type:General  Level of Consciousness: awake, alert  and oriented  Airway & Oxygen Therapy: Patient Spontanous Breathing and Patient connected to nasal cannula oxygen  Post-op Assessment: Report given to RN, Post -op Vital signs reviewed and stable and Patient moving all extremities X 4  Post vital signs: Reviewed and stable  Last Vitals:  Vitals:   09/29/16 0400 09/29/16 0422  BP: 142/85 138/77  Pulse: (!) 48 (!) 46  Resp: 18 18  Temp:  36.9 C    Last Pain:  Vitals:   09/29/16 0821  TempSrc:   PainSc: 4          Complications: No apparent anesthesia complications

## 2016-09-29 NOTE — H&P (Signed)
Reason for Consult: abdominal pain Referring Physician: Lora, Maxwell Herrera is an 31 y.o. male.  HPI: 31 yo male with 1 week of abdominal pain. Pain is constant in the right upper quadrant. He has had nausea, no vomiting. Nothing has made the pain better. He has not had similar episodes in the past. He initially thought it was constipation due to being on pain medication for a strained muscle in his back.  Past Medical History:  Diagnosis Date  . ADHD (attention deficit hyperactivity disorder) 08/11/2011  . ALLERGIC RHINITIS 03/21/2007  . ALLERGY, HX OF 03/19/2007  . ANXIETY 03/21/2007  . ASTHMA 12/12/2008  . IBS 03/19/2007  . OBESITY 03/19/2007  . SINUSITIS- ACUTE-NOS 12/27/2009    Past Surgical History:  Procedure Laterality Date  . STOMACH SURGERY     s/p as toddler for malposition  . TONSILLECTOMY      Family History  Problem Relation Age of Onset  . Asthma Other     Social History:  reports that he has never smoked. He has never used smokeless tobacco. He reports that he does not drink alcohol or use drugs.  Allergies:  Allergies  Allergen Reactions  . Diphenhydramine Hcl Other (See Comments)    Makes him hyper    Medications: I have reviewed the patient's current medications.  Results for orders placed or performed during the hospital encounter of 09/28/16 (from the past 48 hour(s))  Lipase, blood     Status: None   Collection Time: 09/28/16 10:01 PM  Result Value Ref Range   Lipase 34 11 - 51 U/L  Comprehensive metabolic panel     Status: Abnormal   Collection Time: 09/28/16 10:01 PM  Result Value Ref Range   Sodium 139 135 - 145 mmol/L   Potassium 3.8 3.5 - 5.1 mmol/L   Chloride 106 101 - 111 mmol/L   CO2 26 22 - 32 mmol/L   Glucose, Bld 132 (H) 65 - 99 mg/dL   BUN 17 6 - 20 mg/dL   Creatinine, Ser 1.27 (H) 0.61 - 1.24 mg/dL   Calcium 9.1 8.9 - 10.3 mg/dL   Total Protein 7.4 6.5 - 8.1 g/dL   Albumin 4.2 3.5 - 5.0 g/dL   AST 382 (H) 15 - 41 U/L   ALT  286 (H) 17 - 63 U/L   Alkaline Phosphatase 97 38 - 126 U/L   Total Bilirubin 1.3 (H) 0.3 - 1.2 mg/dL   GFR calc non Af Amer >60 >60 mL/min   GFR calc Af Amer >60 >60 mL/min    Comment: (NOTE) The eGFR has been calculated using the CKD EPI equation. This calculation has not been validated in all clinical situations. eGFR's persistently <60 mL/min signify possible Chronic Kidney Disease.    Anion gap 7 5 - 15  CBC     Status: Abnormal   Collection Time: 09/28/16 10:01 PM  Result Value Ref Range   WBC 14.9 (H) 4.0 - 10.5 K/uL   RBC 4.70 4.22 - 5.81 MIL/uL   Hemoglobin 15.7 13.0 - 17.0 g/dL   HCT 43.5 39.0 - 52.0 %   MCV 92.6 78.0 - 100.0 fL   MCH 33.4 26.0 - 34.0 pg   MCHC 36.1 (H) 30.0 - 36.0 g/dL   RDW 11.9 11.5 - 15.5 %   Platelets 299 150 - 400 K/uL  Troponin I     Status: None   Collection Time: 09/28/16 10:04 PM  Result Value Ref Range  Troponin I <0.03 <0.03 ng/mL  Urinalysis, Routine w reflex microscopic     Status: Abnormal   Collection Time: 09/28/16 10:37 PM  Result Value Ref Range   Color, Urine AMBER (A) YELLOW    Comment: BIOCHEMICALS MAY BE AFFECTED BY COLOR   APPearance CLEAR CLEAR   Specific Gravity, Urine 1.026 1.005 - 1.030   pH 5.5 5.0 - 8.0   Glucose, UA NEGATIVE NEGATIVE mg/dL   Hgb urine dipstick NEGATIVE NEGATIVE   Bilirubin Urine SMALL (A) NEGATIVE   Ketones, ur NEGATIVE NEGATIVE mg/dL   Protein, ur 30 (A) NEGATIVE mg/dL   Nitrite NEGATIVE NEGATIVE   Leukocytes, UA NEGATIVE NEGATIVE  Urinalysis, Microscopic (reflex)     Status: Abnormal   Collection Time: 09/28/16 10:37 PM  Result Value Ref Range   RBC / HPF 0-5 0 - 5 RBC/hpf   WBC, UA 0-5 0 - 5 WBC/hpf   Bacteria, UA FEW (A) NONE SEEN   Squamous Epithelial / LPF 0-5 (A) NONE SEEN   Mucous PRESENT     US Abdomen Complete  Result Date: 09/29/2016 CLINICAL DATA:  Subacute onset of fever generalized abdominal pain. Elevated LFTs and leukocytosis. Initial encounter. EXAM: ABDOMEN ULTRASOUND  COMPLETE COMPARISON:  Abdominal radiograph performed 03/01/2008 FINDINGS: Gallbladder: Diffuse gallbladder wall thickening is noted, with pericholecystic fluid. Gallbladder wall measures up to 8 mm in thickness. Multiple large stones are seen within the gallbladder, measuring up to 2.9 cm in size. A positive ultrasonographic Murphy's sign is elicited, compatible with acute cholecystitis. Common bile duct: Diameter: 0.5 cm, within normal limits in caliber. Liver: No focal lesion identified. Diffusely increased parenchymal echogenicity and coarsened echotexture, compatible with fatty infiltration. IVC: No abnormality visualized. Pancreas: Not well characterized. Spleen: Size and appearance within normal limits. Right Kidney: Length: 12.7 cm. Echogenicity within normal limits. No mass or hydronephrosis visualized. Left Kidney: Length: 12.1 cm. Echogenicity within normal limits. No mass or hydronephrosis visualized. Abdominal aorta: No aneurysm visualized. Difficult to fully characterize due to overlying structures. Other findings: None. IMPRESSION: 1. Diffuse gallbladder wall thickening, with pericholecystic fluid and multiple large stones measuring nearly 3 cm in size. Positive ultrasonographic Murphy's sign elicited. Findings compatible with acute cholecystitis. No evidence for obstruction. 2. Diffuse fatty infiltration within the liver. Electronically Signed   By: Garald Balding M.D.   On: 09/29/2016 02:03    Review of Systems  Constitutional: Negative for chills and fever.  HENT: Negative for hearing loss.   Eyes: Negative for blurred vision and double vision.  Respiratory: Negative for cough and hemoptysis.   Cardiovascular: Negative for chest pain and palpitations.  Gastrointestinal: Positive for abdominal pain, diarrhea and nausea. Negative for vomiting.  Genitourinary: Negative for dysuria and urgency.  Musculoskeletal: Negative for myalgias and neck pain.  Skin: Negative for itching and rash.    Neurological: Negative for dizziness, tingling and headaches.  Endo/Heme/Allergies: Does not bruise/bleed easily.  Psychiatric/Behavioral: Negative for depression and suicidal ideas.   Blood pressure 133/73, pulse (!) 44, temperature 98.7 F (37.1 C), temperature source Oral, resp. rate 16, height _0  (1.778 m), weight 97.5 kg (215 lb), SpO2 97 %. Physical Exam  Vitals reviewed. Constitutional: He is oriented to person, place, and time. He appears well-developed and well-nourished.  HENT:  Head: Normocephalic and atraumatic.  Eyes: Conjunctivae and EOM are normal. Pupils are equal, round, and reactive to light.  Neck: Normal range of motion. Neck supple.  Cardiovascular: Normal rate and regular rhythm.   Respiratory: Effort normal and breath sounds  normal.  GI: Soft. Bowel sounds are normal. He exhibits no distension. There is tenderness in the right upper quadrant. There is guarding.  Musculoskeletal: Normal range of motion.  Neurological: He is alert and oriented to person, place, and time.  Skin: Skin is warm and dry.  Psychiatric: He has a normal mood and affect. His behavior is normal.      Assessment/Plan: 31 yo male with RUQ abdominal pain, leukocytosis, multiple large stones on [place]. -admit to hospital -IV abx -plan for OR in am for cholecystectomy -NPO -pain control  Arta Bruce Kinsinger 09/29/2016, 3:02 AM

## 2016-09-29 NOTE — ED Notes (Signed)
[facility] for [facility] US for RUQ pain. Patient going by POV with family member. 20G PIV to right AC wrapped in kerlex..Marland Kitchen

## 2016-09-29 NOTE — Progress Notes (Signed)
Patient going to surgery, called pre-op and gave them report, completed pre-op checklist, rolling off unit now. Patient's family at bedside and aware of procedure.

## 2016-09-29 NOTE — ED Notes (Signed)
Patient transported to Ultrasound 

## 2016-09-29 NOTE — ED Notes (Signed)
Pt is brought back to room and immediately is taken to US in wheelchair.  Pt is pale, alert and oriented.

## 2016-09-29 NOTE — Op Note (Signed)
LAPAROSCOPIC CHOLECYSTECTOMY WITH INTRAOPERATIVE CHOLANGIOGRAM  Procedure Note  Maxwell Herrera 09/28/2016 - 09/29/2016   Pre-op Diagnosis: Acute cholecystitis with cholelithiasis     Post-op Diagnosis: Same  Procedure(s): LAPAROSCOPIC CHOLECYSTECTOMY WITH INTRAOPERATIVE CHOLANGIOGRAM  Surgeon(s): Abigail Miyamotoouglas Jayceon Troy, MD Jimmye NormanJames Wyatt M.D. Anesthesia: General  Staff:  Circulator: Raliegh ScarletJudy G Burroughs, RN Radiology Technologist: Moss Mcerri M Joyner Relief Circulator: Lowella PettiesMegan D Cavanaugh, RN Relief Scrub: Lowella PettiesMegan D Cavanaugh, RN Scrub Person: Mindy K Teschner, CST; Noukone M Panchit  Estimated Blood Loss: 300 mL               Specimens: Sent to pathology  Findings: The patient was found to have an acutely inflamed gallbladder full of large gallstones. Cholangiogram was normal.  Procedure: The patient was brought to the operating room and identified the correct patient. He is placed supine on the operating table and general anesthesia was induced. His abdomen was then prepped and draped in the usual sterile fashion. I made Herrera small transverse incision below the umbilicus. I carried this down to the fascia which was then opened with Herrera scalpel. Herrera hemostat was then used to pass into the peritoneal cavity under direct vision. Herrera 0 Vicryl Pershing suture was placed around the fascia opening. [facility] through the opening and insufflation was begun I then placed Herrera 5 mL trocar in the patient's epigastrium and 2 more in the right upper quadrant all under direct vision. The gallbladder found to be acutely inflamed and distended. I was able to peel omentum off of the gallbladder. I then attempted to needle aspirate bile from the gallbladder as it could not be grasped.  I was only able to minimally decompressed the gallbladder. The gallbladder itself was very friable as well as the surrounding liver. It was very difficult to dissect down at the base the gallbladder. I was finally able to achieve Herrera critical  window around the cystic duct. I placed Herrera surgical clip distal on the cystic duct and then opened up with the laparoscopic scissors. I then inserted Herrera cholangiogram in the right upper quadrant under direct vision. I placed this into the opening in the cystic duct. I cleaned grams and performed. The bile duct appeared normal without evidence of dilation or obstruction. I then removed declines Herrera catheter. I clipped the cystic duct several times proximally and transected. The cystic artery as well as posterior branch were identified and clipped with surgical clips as well.  Again, the liver bed was very friable. The gallbladder was slowly dissected free from the liver bed with the cautery. I then placed the gallbladder in Herrera bag and removed it to the trocar at the umbilicus.  We had to make the fascial incision much larger as well as the skin to get this large gallbladder out. We then thoroughly irrigated the abdomen with normal saline. Hemostasis appeared to be achieved. I placed Herrera piece of surgical snow in the gallbladder fossa. All ports were then removed under direct vision. I then closed the fascia at the umbilicus with several figure-of-eight 0 Vicryl sutures. All incisions were anesthetized Marcaine and closed with 4-0 Monocryl sutures. Skin glue was then applied. The patient tolerated procedure well. All the counts were correct at the end of the procedure. The patient was then extubated in the operating room and taken in Herrera stable condition to the recovery room.          Maxwell Herrera   Date: 09/29/2016  Time: 12:54 PM

## 2016-09-29 NOTE — Anesthesia Postprocedure Evaluation (Addendum)
Anesthesia Post Note  Patient: Yehuda MaoDaniel R Hannum  Procedure(s) Performed: Procedure(s) (LRB): LAPAROSCOPIC CHOLECYSTECTOMY WITH INTRAOPERATIVE CHOLANGIOGRAM (N/A)  Patient location during evaluation: PACU Anesthesia Type: General Level of consciousness: oriented, patient cooperative and sedated Pain management: pain level controlled Vital Signs Assessment: post-procedure vital signs reviewed and stable Respiratory status: spontaneous breathing, nonlabored ventilation, respiratory function stable and patient connected to nasal cannula oxygen Cardiovascular status: blood pressure returned to baseline and stable Postop Assessment: no signs of nausea or vomiting Anesthetic complications: no       Last Vitals:  Vitals:   09/29/16 1333 09/29/16 1348  BP: 137/79 134/79  Pulse: 85 96  Resp: 19 16  Temp:      Last Pain:  Vitals:   09/29/16 1356  TempSrc:   PainSc: 3                  Ninetta Adelstein,E. Tyreka Henneke

## 2016-09-30 ENCOUNTER — Encounter (HOSPITAL_COMMUNITY): Payer: Self-pay | Admitting: Surgery

## 2016-09-30 LAB — CBC
HEMATOCRIT: 38.5 % — AB (ref 39.0–52.0)
Hemoglobin: 13.3 g/dL (ref 13.0–17.0)
MCH: 32.4 pg (ref 26.0–34.0)
MCHC: 34.5 g/dL (ref 30.0–36.0)
MCV: 93.9 fL (ref 78.0–100.0)
PLATELETS: 249 10*3/uL (ref 150–400)
RBC: 4.1 MIL/uL — ABNORMAL LOW (ref 4.22–5.81)
RDW: 12.4 % (ref 11.5–15.5)
WBC: 9.2 10*3/uL (ref 4.0–10.5)

## 2016-09-30 MED ORDER — IBUPROFEN 600 MG PO TABS
600.0000 mg | ORAL_TABLET | Freq: Four times a day (QID) | ORAL | Status: AC
  Administered 2016-09-30 – 2016-10-01 (×4): 600 mg via ORAL
  Filled 2016-09-30 (×4): qty 1

## 2016-09-30 MED ORDER — PROMETHAZINE HCL 25 MG/ML IJ SOLN
12.5000 mg | Freq: Four times a day (QID) | INTRAMUSCULAR | Status: DC | PRN
  Administered 2016-09-30: 12.5 mg via INTRAVENOUS
  Filled 2016-09-30: qty 1

## 2016-09-30 MED ORDER — MORPHINE SULFATE (PF) 2 MG/ML IV SOLN
1.0000 mg | INTRAVENOUS | Status: DC | PRN
  Administered 2016-09-30: 1 mg via INTRAVENOUS
  Filled 2016-09-30: qty 1

## 2016-09-30 MED ORDER — ENOXAPARIN SODIUM 40 MG/0.4ML ~~LOC~~ SOLN
40.0000 mg | SUBCUTANEOUS | Status: DC
  Administered 2016-09-30 – 2016-10-01 (×2): 40 mg via SUBCUTANEOUS
  Filled 2016-09-30 (×2): qty 0.4

## 2016-09-30 NOTE — Progress Notes (Signed)
Patient ID: Maxwell Herrera, male   DOB: [birth year], 31 y.o.   MRN: [id number]  [facility] Surgery Progress Note  1 Day Post-Op  Subjective: Patient states that he is feeling much better than prior to surgery. He is tolerating clear liquids. States that he has not passed flatus or had a BM yet. He has ambulated to the bathroom with no issues. Pain still requiring small amount of IV pain medication.  Objective: Vital signs in last 24 hours: Temp:  [97.7 F (36.5 C)-98.8 F (37.1 C)] 98.8 F (37.1 C) (02/27 0517) Pulse Rate:  [85-96] 88 (02/27 0517) Resp:  [14-20] 19 (02/27 0517) BP: (102-141)/(54-79) 128/58 (02/27 0517) SpO2:  [90 %-96 %] 90 % (02/27 0517) Last BM Date: 09/28/16  Intake/Output from previous day: 02/26 0701 - 02/27 0700 In: 3250 [P.O.:400; I.V.:2850] Out: 350 [Urine:50; Blood:300] Intake/Output this shift: No intake/output data recorded.  PE: Gen:  Alert, NAD, pleasant Card:  RRR, no M/G/R heard Pulm:  CTAB, no W/R/R, effort normal Abd: Soft, appropriately tender, ND, +BS, no HSM, incisions C/D/I Ext:  No erythema, edema, or tenderness   Lab Results:   Recent Labs  09/28/16 2201 09/30/16 0325  WBC 14.9* 9.2  HGB 15.7 13.3  HCT 43.5 38.5*  PLT 299 249   BMET  Recent Labs  09/28/16 2201  NA 139  K 3.8  CL 106  CO2 26  GLUCOSE 132*  BUN 17  CREATININE 1.27*  CALCIUM 9.1   PT/INR No results for input(s): LABPROT, INR in the last 72 hours. CMP     Component Value Date/Time   NA 139 09/28/2016 2201   K 3.8 09/28/2016 2201   CL 106 09/28/2016 2201   CO2 26 09/28/2016 2201   GLUCOSE 132 (H) 09/28/2016 2201   BUN 17 09/28/2016 2201   CREATININE 1.27 (H) 09/28/2016 2201   CALCIUM 9.1 09/28/2016 2201   PROT 7.4 09/28/2016 2201   ALBUMIN 4.2 09/28/2016 2201   AST 382 (H) 09/28/2016 2201   ALT 286 (H) 09/28/2016 2201   ALKPHOS 97 09/28/2016 2201   BILITOT 1.3 (H) 09/28/2016 2201   GFRNONAA >60 09/28/2016 2201   GFRAA >60 09/28/2016  2201   Lipase     Component Value Date/Time   LIPASE 34 09/28/2016 2201       Studies/Results: Dg Cholangiogram Operative  Result Date: 09/29/2016 CLINICAL DATA:  Intraoperative cholangiogram during laparoscopic cholecystectomy. EXAM: INTRAOPERATIVE CHOLANGIOGRAM FLUOROSCOPY TIME:  8 seconds COMPARISON:  Abdominal ultrasound - 09/29/2016 FINDINGS: Intraoperative cholangiographic images of the right upper abdominal quadrant during laparoscopic cholecystectomy are provided for review. Surgical clips overlie the expected location of the gallbladder fossa. Contrast injection demonstrates selective cannulation of the central aspect of the cystic duct. There is passage of contrast through the central aspect of the cystic duct with filling of a non dilated common bile duct. There is passage of contrast though the CBD and into the descending portion of the duodenum. There is minimal reflux of injected contrast into the common hepatic duct and central aspect of the non dilated intrahepatic biliary system. There is minimal opacification of the central aspect of the pancreatic duct which appears nondilated. There are no discrete filling defects within the opacified portions of the biliary system to suggest the presence of choledocholithiasis. IMPRESSION: No evidence of choledocholithiasis. Electronically Signed   By: Simonne Come M.D.   On: 09/29/2016 12:56   US Abdomen Complete  Result Date: 09/29/2016 CLINICAL DATA:  Subacute onset of fever generalized  abdominal pain. Elevated LFTs and leukocytosis. Initial encounter. EXAM: ABDOMEN ULTRASOUND COMPLETE COMPARISON:  Abdominal radiograph performed 03/01/2008 FINDINGS: Gallbladder: Diffuse gallbladder wall thickening is noted, with pericholecystic fluid. Gallbladder wall measures up to 8 mm in thickness. Multiple large stones are seen within the gallbladder, measuring up to 2.9 cm in size. A positive ultrasonographic Murphy's sign is elicited, compatible with  acute cholecystitis. Common bile duct: Diameter: 0.5 cm, within normal limits in caliber. Liver: No focal lesion identified. Diffusely increased parenchymal echogenicity and coarsened echotexture, compatible with fatty infiltration. IVC: No abnormality visualized. Pancreas: Not well characterized. Spleen: Size and appearance within normal limits. Right Kidney: Length: 12.7 cm. Echogenicity within normal limits. No mass or hydronephrosis visualized. Left Kidney: Length: 12.1 cm. Echogenicity within normal limits. No mass or hydronephrosis visualized. Abdominal aorta: No aneurysm visualized. Difficult to fully characterize due to overlying structures. Other findings: None. IMPRESSION: 1. Diffuse gallbladder wall thickening, with pericholecystic fluid and multiple large stones measuring nearly 3 cm in size. Positive ultrasonographic Murphy's sign elicited. Findings compatible with acute cholecystitis. No evidence for obstruction. 2. Diffuse fatty infiltration within the liver. Electronically Signed   By: Roanna RaiderJeffery  Chang M.D.   On: 09/29/2016 02:03    Anti-infectives: Anti-infectives    Start     Dose/Rate Route Frequency Ordered Stop   09/29/16 1200  cefTRIAXone (ROCEPHIN) 2 g in dextrose 5 % 50 mL IVPB     2 g 100 mL/hr over 30 Minutes Intravenous Every 24 hours 09/29/16 0419     09/29/16 0230  piperacillin-tazobactam (ZOSYN) IVPB 3.375 g     3.375 g 100 mL/hr over 30 Minutes Intravenous  Once 09/29/16 0229 09/29/16 0324       Assessment/Plan Acute cholecystitis with cholelithiasis  S/p LAPAROSCOPIC CHOLECYSTECTOMY WITH INTRAOPERATIVE CHOLANGIOGRAM 2/26 Dr. Magnus IvanBlackman - POD 1 - IOC negative  ID - rocephin 2/26>> FEN - soft diet VTE - SCDs, lovenox  Plan - continue advancing diet as tolerated and working on pain control. Will add ibuprofen. Encourage ambulation. Likely home this afternoon vs tomorrow.   LOS: 0 days    Edson SnowballBROOKE A MILLER , [place] [facility] 09/30/2016, 7:37  AM Pager: [phone] Consults: [phone] Mon-Fri 7:00 am-4:30 pm Sat-Sun 7:00 am-11:30 am

## 2016-10-01 MED ORDER — HYDROCODONE-ACETAMINOPHEN 5-325 MG PO TABS: 1.0000 | ORAL_TABLET | Freq: Four times a day (QID) | ORAL | 0 refills | Status: DC | PRN

## 2016-10-01 MED ORDER — ONDANSETRON 4 MG PO TBDP: 4.0000 mg | ORAL_TABLET | Freq: Four times a day (QID) | ORAL | 0 refills | Status: DC | PRN

## 2016-10-01 NOTE — Progress Notes (Signed)
Patient discharged to home with instructions and prescriptions. 

## 2016-10-01 NOTE — Discharge Summary (Signed)
[facility] Discharge Summary   Patient ID: Maxwell MaoDaniel R Herrera MRN: [id number] DOB/AGE: [birth year] 29 y.o.  Admit date: 09/28/2016 Discharge date: 10/01/2016  Admitting Diagnosis: RUQ pain  Discharge Diagnosis Patient Active Problem List   Diagnosis Date Noted  . Acute cholecystitis 09/29/2016  . Acute sinus infection 12/07/2013  . Insomnia 05/24/2013  . ADHD (attention deficit hyperactivity disorder) 08/11/2011  . Preventative health care 06/19/2011  . ASTHMA 12/12/2008  . ANXIETY 03/21/2007  . ALLERGIC RHINITIS 03/21/2007  . OBESITY 03/19/2007  . IBS 03/19/2007    Consultants None  Imaging: Dg Cholangiogram Operative  Result Date: 09/29/2016 CLINICAL DATA:  Intraoperative cholangiogram during laparoscopic cholecystectomy. EXAM: INTRAOPERATIVE CHOLANGIOGRAM FLUOROSCOPY TIME:  8 seconds COMPARISON:  Abdominal ultrasound - 09/29/2016 FINDINGS: Intraoperative cholangiographic images of the right upper abdominal quadrant during laparoscopic cholecystectomy are provided for review. Surgical clips overlie the expected location of the gallbladder fossa. Contrast injection demonstrates selective cannulation of the central aspect of the cystic duct. There is passage of contrast through the central aspect of the cystic duct with filling of a non dilated common bile duct. There is passage of contrast though the CBD and into the descending portion of the duodenum. There is minimal reflux of injected contrast into the common hepatic duct and central aspect of the non dilated intrahepatic biliary system. There is minimal opacification of the central aspect of the pancreatic duct which appears nondilated. There are no discrete filling defects within the opacified portions of the biliary system to suggest the presence of choledocholithiasis. IMPRESSION: No evidence of choledocholithiasis. Electronically Signed   By: Simonne ComeJohn  Watts M.D.   On: 09/29/2016 12:56    Procedures Dr. Magnus IvanBlackman  (09/29/16) - [facility]  Hospital Course:  Maxwell MaoDaniel R Kestler  is a 30yo male who [facility] with 1 week of RUQ pain.  Workup showed leukocytosis and cholelithiasis.  Patient was admitted and underwent procedure listed above.  Tolerated procedure well and was transferred to the floor.  Diet was advanced as tolerated.  He did have some minor pain control issues on POD1 but this quickly improved. On POD2 the patient was voiding well, tolerating diet, ambulating well, pain well controlled, vital signs stable, incisions c/d/i and felt stable for discharge home.  Patient will follow up in our office in 3 weeks and knows to call with questions or concerns.   I have personally reviewed the patients medication history on the [place] controlled substance database.   Physical Exam: General:  Alert, NAD, pleasant, comfortable Pulm: effort normal Abd:  Soft, NT, ND, appropriately tender, incisions C/D/I, area of ecchymosis distal to umbilicus  Allergies as of 10/01/2016      Reactions   Diphenhydramine Hcl Other (See Comments)   Makes him hyper      Medication List    TAKE these medications   clonazePAM 1 MG tablet Commonly known as:  KLONOPIN 1/2 - 1 tab by mouth twice per day as needed What changed:  how much to take  how to take this  when to take this  reasons to take this  additional instructions   cyclobenzaprine 5 MG tablet Commonly known as:  FLEXERIL Take 5 mg by mouth every 6 (six) hours as needed for muscle spasms.   escitalopram 20 MG tablet Commonly known as:  LEXAPRO Take 1 tablet (20 mg total) by mouth daily.   HYDROcodone-acetaminophen 5-325 MG tablet Commonly known as:  NORCO/VICODIN Take 1-2 tablets by mouth every 6 (six) hours  as needed for moderate pain.   ibuprofen 800 MG tablet Commonly known as:  ADVIL,MOTRIN Take 1 tablet (800 mg total) by mouth every 8 (eight) hours as needed for mild pain.   methocarbamol 500 MG  tablet Commonly known as:  ROBAXIN Take 1 tablet (500 mg total) by mouth every 8 (eight) hours as needed for muscle spasms.   ondansetron 4 MG disintegrating tablet Commonly known as:  ZOFRAN-ODT Take 1 tablet (4 mg total) by mouth every 6 (six) hours as needed for nausea.        Follow-up Information    [facility], [place]. Go on 10/28/2016.   Specialty:  General Surgery Why:  Your appointment is 10/28/16 @ 9:45AM. Please arrive 30 minutes prior to your appointment to check in and fill out necessary paperwork. Contact information: [address] [place] [address] [phone]          Signed: Edson Snowball, [facility] 10/01/2016, 7:57 AM Pager: [phone] Consults: [phone] Mon-Fri 7:00 am-4:30 pm Sat-Sun 7:00 am-11:30 am

## 2016-10-01 NOTE — Discharge Instructions (Addendum)
*please watch the bruised area below your belly button, if it starts to turn red and extend outside the circle drawn with Sharpie please call and let us know.  LAPAROSCOPIC SURGERY: POST OP INSTRUCTIONS  1. DIET: Follow a light bland diet the first 24 hours after arrival home, such as soup, liquids, crackers, etc. Be sure to include lots of fluids daily. Avoid fast food or heavy meals as your are more likely to get nauseated. Eat a low fat the next few days after surgery.  2. Take your usually prescribed home medications unless otherwise directed. 3. PAIN CONTROL:  1. Pain is best controlled by a usual combination of three different methods TOGETHER:  1. Ice/Heat 2. Over the counter pain medication 3. Prescription pain medication 2. Most patients will experience some swelling and bruising around the incisions. Ice packs or heating pads (30-60 minutes up to 6 times a day) will help. Use ice for the first few days to help decrease swelling and bruising, then switch to heat to help relax tight/sore spots and speed recovery. Some people prefer to use ice alone, heat alone, alternating between ice & heat. Experiment to what works for you. Swelling and bruising can take several weeks to resolve.  3. It is helpful to take an over-the-counter pain medication regularly for the first few weeks. Choose one of the following that works best for you:  1. Naproxen (Aleve, etc) Two 220mg  tabs twice a day 2. Ibuprofen (Advil, etc) Three 200mg  tabs four times a day (every meal & bedtime) 3. Acetaminophen (Tylenol, etc) 500-650mg  four times a day (every meal & bedtime) 4. A prescription for pain medication (such as oxycodone, hydrocodone, etc) should be given to you upon discharge. Take your pain medication as prescribed.  1. If you are having problems/concerns with the prescription medicine (does not control pain, nausea, vomiting, rash, itching, etc), please call us [phone] to see if we need to switch you to a  different pain medicine that will work better for you and/or control your side effect better. 2. If you need a refill on your pain medication, please contact your pharmacy. They will contact our office to request authorization. Prescriptions will not be filled after 5 pm or on week-ends. 4. Avoid getting constipated. Between the surgery and the pain medications, it is common to experience some constipation. Increasing fluid intake and taking a fiber supplement (such as Metamucil, Citrucel, FiberCon, MiraLax, etc) 1-2 times a day regularly will usually help prevent this problem from occurring. A mild laxative (prune juice, Milk of Magnesia, MiraLax, etc) should be taken according to package directions if there are no bowel movements after 48 hours.  5. Watch out for diarrhea. If you have many loose bowel movements, simplify your diet to bland foods & liquids for a few days. Stop any stool softeners and decrease your fiber supplement. Switching to mild anti-diarrheal medications (Kayopectate, Pepto Bismol) can help. If this worsens or does not improve, please call us. 6. Wash / shower every day. You may shower over the dressings as they are waterproof. Continue to shower over incision(s) after the dressing is off. If there is glue over the incisions try not to pick it off, let it fall off naturally. 7. Remove your waterproof bandages 5 days after surgery. You may leave the incision open to air. You may replace a dressing/Band-Aid to cover the incision for comfort if you wish.  8. ACTIVITIES as tolerated:  1. You may resume regular (light) daily activities  beginning the next day--such as daily self-care, walking, climbing stairs--gradually increasing activities as tolerated. If you can walk 30 minutes without difficulty, it is safe to try more intense activity such as jogging, treadmill, bicycling, low-impact aerobics, swimming, etc. 2. Save the most intensive and strenuous activity for last such as sit-ups,  heavy lifting, contact sports, etc Refrain from any heavy lifting or straining until you are off narcotics for pain control. For the first 2-3 weeks do not lift over 10-15lb.  3. DO NOT PUSH THROUGH PAIN. Let pain be your guide: If it hurts to do something, don't do it. Pain is your body warning you to avoid that activity for another week until the pain goes down. 4. You may drive when you are no longer taking prescription pain medication, you can comfortably wear a seatbelt, and you can safely maneuver your car and apply brakes. 5. You may have sexual intercourse when it is comfortable.  9. FOLLOW UP in our office  1. Please call [facility] at [phone] to set up an appointment to see your surgeon in the office for a follow-up appointment approximately 2-3 weeks after your surgery. 2. Make sure that you call for this appointment the day you arrive home to insure a convenient appointment time.      10. IF YOU HAVE DISABILITY OR FAMILY LEAVE FORMS, BRING THEM TO THE               OFFICE FOR PROCESSING.   WHEN TO CALL us [phone]:  1. Poor pain control 2. Reactions / problems with new medications (rash/itching, nausea, etc)  3. Fever over 101.5 F (38.5 C) 4. Inability to urinate 5. Nausea and/or vomiting 6. Worsening swelling or bruising 7. Continued bleeding from incision. 8. Increased pain, redness, or drainage from the incision  The clinic staff is available to answer your questions during regular business hours (8:30am-5pm). Please dont hesitate to call and ask to speak to one of our nurses for clinical concerns.  If you have a medical emergency, go to the nearest emergency room or call 911.  A surgeon from [facility] is always on call at the [facility], [place]  [address], [place], [place] [address] ?  MAIN: [phone] ? TOLL FREE: [phone] ?  FAX [phone]  [email or web address]

## 2017-02-16 NOTE — Addendum Note (Signed)
Addendum  created 02/16/17 1607 by Jairo BenJackson, Williemae Muriel, MD   Delete clinical note, Sign clinical note

## 2017-02-16 NOTE — Anesthesia Postprocedure Evaluation (Deleted)
Anesthesia Post Note  Patient: Maxwell Herrera  Procedure(s) Performed: Procedure(s) (LRB): LAPAROSCOPIC CHOLECYSTECTOMY WITH INTRAOPERATIVE CHOLANGIOGRAM (N/A)     Anesthesia Post Evaluation  Last Vitals:  Vitals:   09/30/16 2100 10/01/16 0440  BP: 121/61 109/65  Pulse: (!) 50 (!) 55  Resp: 19 19  Temp: 37.4 C 37.1 C    Last Pain:  Vitals:   10/01/16 0901  TempSrc:   PainSc: 2                  Azriel Dancy,E. Manvir Prabhu

## 2017-02-16 NOTE — Addendum Note (Signed)
Addendum  created 02/16/17 1551 by Trei Schoch, MD   Sign clinical note    

## 2017-02-21 ENCOUNTER — Encounter (HOSPITAL_BASED_OUTPATIENT_CLINIC_OR_DEPARTMENT_OTHER): Payer: Self-pay | Admitting: Emergency Medicine

## 2017-02-21 DIAGNOSIS — M549 Dorsalgia, unspecified: Secondary | ICD-10-CM | POA: Insufficient documentation

## 2017-02-21 DIAGNOSIS — J45909 Unspecified asthma, uncomplicated: Secondary | ICD-10-CM | POA: Diagnosis not present

## 2017-02-21 NOTE — ED Triage Notes (Signed)
Pt presents with c/o back and rt sided abdominal pain and nausea

## 2017-02-22 ENCOUNTER — Emergency Department (HOSPITAL_BASED_OUTPATIENT_CLINIC_OR_DEPARTMENT_OTHER)
Admission: EM | Admit: 2017-02-22 | Discharge: 2017-02-22 | Disposition: A | Discharge status: Home / Self Care | Payer: BLUE CROSS/BLUE SHIELD | Attending: Emergency Medicine | Admitting: Emergency Medicine

## 2017-02-22 DIAGNOSIS — M549 Dorsalgia, unspecified: Secondary | ICD-10-CM

## 2017-02-22 LAB — URINALYSIS, ROUTINE W REFLEX MICROSCOPIC
Bilirubin Urine: NEGATIVE
Glucose, UA: NEGATIVE mg/dL
HGB URINE DIPSTICK: NEGATIVE
KETONES UR: NEGATIVE mg/dL
Leukocytes, UA: NEGATIVE
NITRITE: NEGATIVE
PROTEIN: NEGATIVE mg/dL
Specific Gravity, Urine: 1.023 (ref 1.005–1.030)
pH: 7 (ref 5.0–8.0)

## 2017-02-22 NOTE — ED Provider Notes (Signed)
[facility] Provider Note: Lowella Dell, MD, FACEP  CSN: [id number] MRN: [id number] ARRIVAL: 02/21/17 at 2301 ROOM: [facility]08/[facility]08   CHIEF COMPLAINT  Back Pain   HISTORY OF PRESENT ILLNESS  STERLING MONDO is a 31 y.o. male with a two-day history of pain in his mid back. The pain is well localized and he rates it a 5 out of 10. There is some change with movement. He has also had some abdominal discomfort which she describes as not a pain but rather a sensation that he needs to move his bowels. He denies nausea, vomiting, dysuria or hematuria. He has taken ibuprofen without relief of the pain.   Past Medical History:  Diagnosis Date  . ADHD (attention deficit hyperactivity disorder) 08/11/2011  . ALLERGIC RHINITIS 03/21/2007  . ALLERGY, HX OF 03/19/2007  . ANXIETY 03/21/2007  . ASTHMA 12/12/2008  . IBS 03/19/2007  . OBESITY 03/19/2007  . SINUSITIS- ACUTE-NOS 12/27/2009    Past Surgical History:  Procedure Laterality Date  . CHOLECYSTECTOMY N/A 09/29/2016   Procedure: LAPAROSCOPIC CHOLECYSTECTOMY WITH INTRAOPERATIVE CHOLANGIOGRAM;  Surgeon: Abigail Miyamoto, MD;  Location: [facility];  Service: General;  Laterality: N/A;  . STOMACH SURGERY     s/p as toddler for malposition  . TONSILLECTOMY      Family History  Problem Relation Age of Onset  . Asthma Other     Social History  Substance Use Topics  . Smoking status: Never Smoker  . Smokeless tobacco: Never Used  . Alcohol use No    Prior to Admission medications   Medication Sig Start Date End Date Taking? Authorizing Provider  clonazePAM (KLONOPIN) 1 MG tablet 1/2 - 1 tab by mouth twice per day as needed Patient taking differently: Take 1 mg by mouth daily as needed for anxiety.  11/25/13   Corwin Levins, MD  cyclobenzaprine (FLEXERIL) 5 MG tablet Take 5 mg by mouth every 6 (six) hours as needed for muscle spasms.  09/28/16   [provider]  escitalopram (LEXAPRO) 20 MG tablet Take 1 tablet (20 mg total) by mouth  daily. 02/21/13   Corwin Levins, MD  HYDROcodone-acetaminophen (NORCO/VICODIN) 5-325 MG tablet Take 1-2 tablets by mouth every 6 (six) hours as needed for moderate pain. 10/01/16   Edson Snowball, PA-C  ibuprofen (ADVIL,MOTRIN) 800 MG tablet Take 1 tablet (800 mg total) by mouth every 8 (eight) hours as needed for mild pain. 09/19/16   Ward, Layla Maw, DO  methocarbamol (ROBAXIN) 500 MG tablet Take 1 tablet (500 mg total) by mouth every 8 (eight) hours as needed for muscle spasms. Patient not taking: Reported on 09/29/2016 09/19/16   Ward, Layla Maw, DO  ondansetron (ZOFRAN-ODT) 4 MG disintegrating tablet Take 1 tablet (4 mg total) by mouth every 6 (six) hours as needed for nausea. 10/01/16   Evangeline Gula A, PA-C    Allergies Diphenhydramine hcl   REVIEW OF SYSTEMS  Negative except as noted here or in the History of Present Illness.   PHYSICAL EXAMINATION  Initial Vital Signs Blood pressure (!) 116/92, pulse (!) 50, temperature 99.1 F (37.3 C), temperature source Oral, resp. rate 20, SpO2 99 %.  Examination General: Well-developed, well-nourished male in no acute distress; appearance consistent with age of record HENT: normocephalic; atraumatic Eyes: pupils equal, round and reactive to light; extraocular muscles intact Neck: supple Heart: regular rate and rhythm Lungs: clear to auscultation bilaterally Abdomen: soft; nondistended; nontender; no masses or hepatosplenomegaly; bowel sounds present Back: No CVA tenderness;  no spinal tenderness; pain in mid back on movement Extremities: No deformity; full range of motion; pulses normal Neurologic: Awake, alert and oriented; motor function intact in all extremities and symmetric; no facial droop Skin: Warm and dry Psychiatric: Normal mood and affect   RESULTS  Summary of this visit's results, reviewed by myself:   EKG Interpretation  Date/Time:    Ventricular Rate:    PR Interval:    QRS Duration:   QT Interval:    QTC  Calculation:   R Axis:     Text Interpretation:        Laboratory Studies: Results for orders placed or performed during the hospital encounter of 02/22/17 (from the past 24 hour(s))  Urinalysis, Routine w reflex microscopic     Status: None   Collection Time: 02/22/17  2:30 AM  Result Value Ref Range   Color, Urine YELLOW YELLOW   APPearance CLEAR CLEAR   Specific Gravity, Urine 1.023 1.005 - 1.030   pH 7.0 5.0 - 8.0   Glucose, UA NEGATIVE NEGATIVE mg/dL   Hgb urine dipstick NEGATIVE NEGATIVE   Bilirubin Urine NEGATIVE NEGATIVE   Ketones, ur NEGATIVE NEGATIVE mg/dL   Protein, ur NEGATIVE NEGATIVE mg/dL   Nitrite NEGATIVE NEGATIVE   Leukocytes, UA NEGATIVE NEGATIVE   Imaging Studies: No results found.  ED COURSE  Nursing notes and initial vitals signs, including pulse oximetry, reviewed.  Vitals:   02/21/17 2310  BP: (!) 116/92  Pulse: (!) 50  Resp: 20  Temp: 99.1 F (37.3 C)  TempSrc: Oral  SpO2: 99%    PROCEDURES    ED DIAGNOSES     ICD-10-CM   1. Mid back pain M54.9        Mihailo Sage, Jonny RuizJohn, MD 02/22/17 [phone]

## 2017-11-20 IMAGING — RF DG CHOLANGIOGRAM OPERATIVE
1 series · 5 of 5 positions shown · non-contrast
Comparison: Abdominal ultrasound - 09/29/2016

CLINICAL DATA: Intraoperative cholangiogram during laparoscopic
cholecystectomy.

EXAM:
INTRAOPERATIVE CHOLANGIOGRAM
FLUOROSCOPY TIME:  8 seconds

[Series 1: run · 3 acquisitions, 5 frames shown]
[im 1/3]
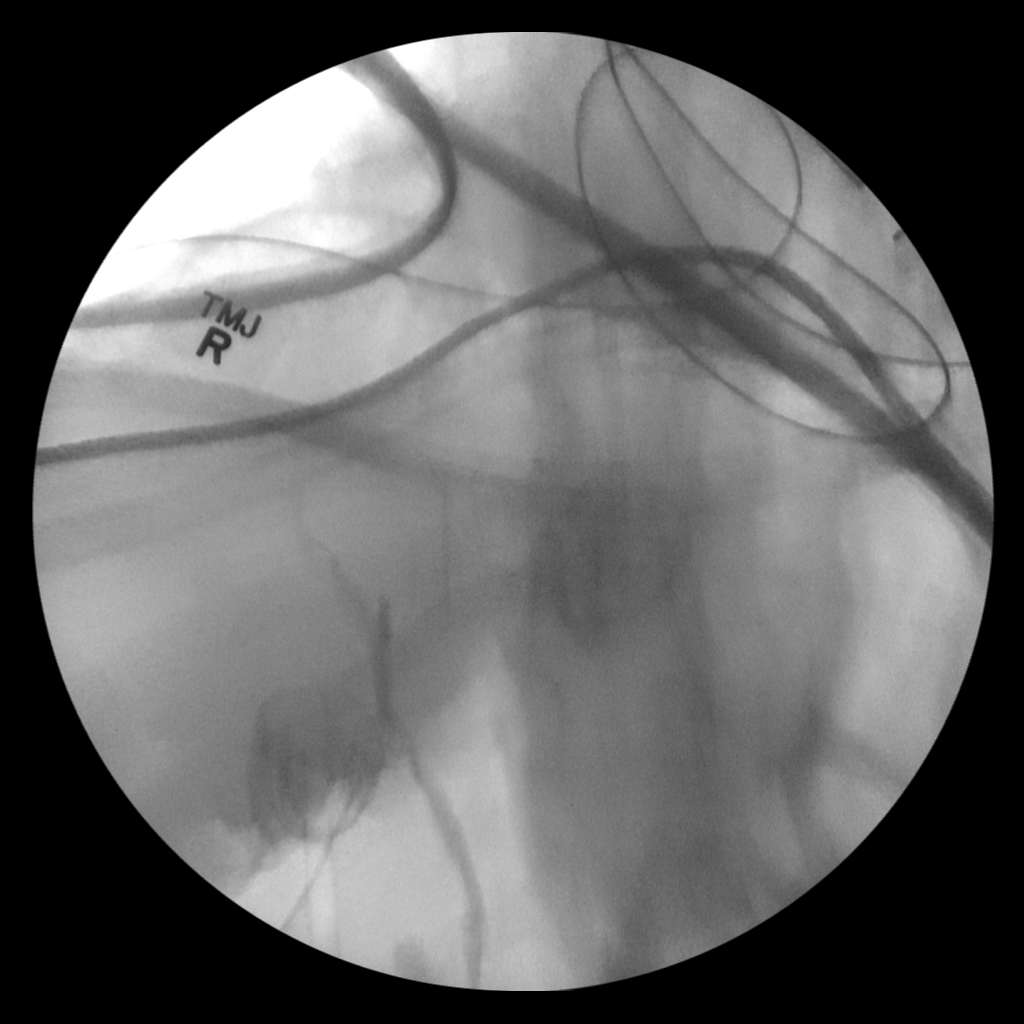
[im 1/3]
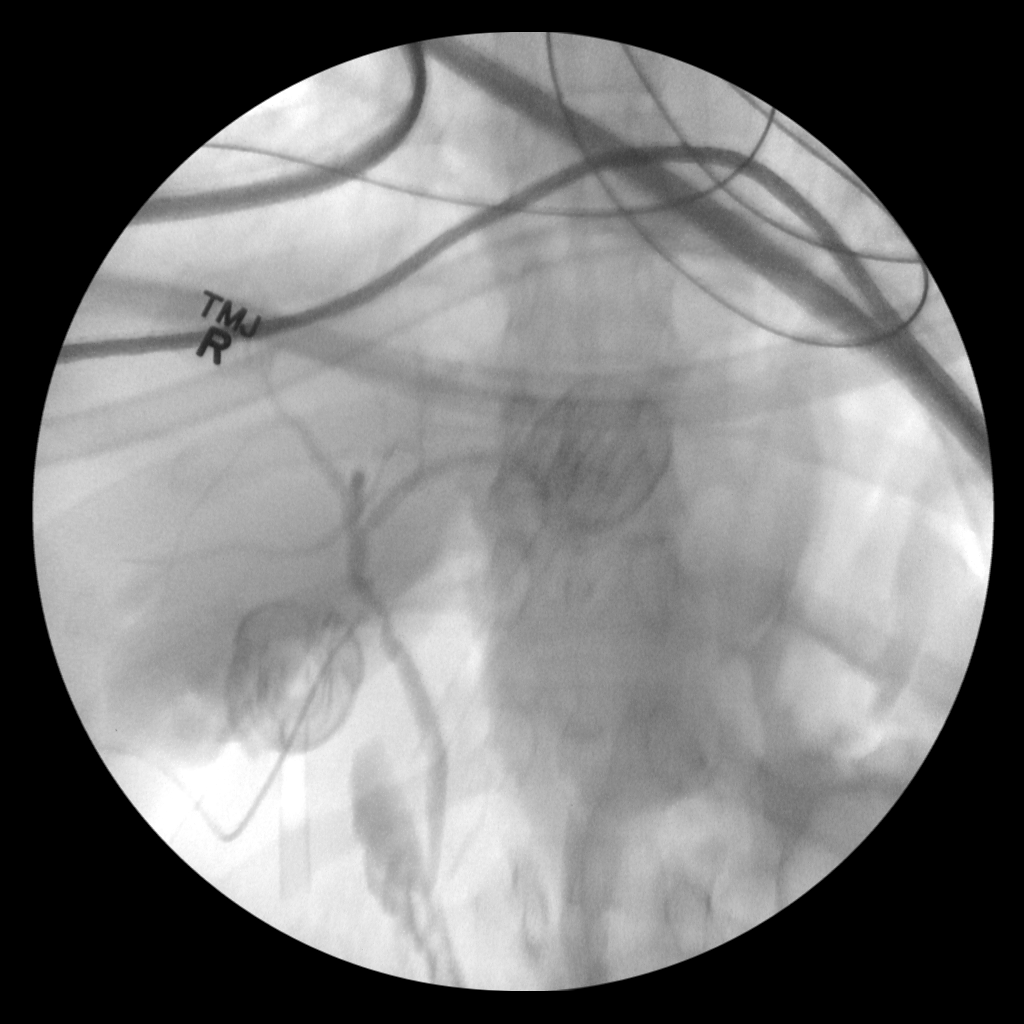
[im 1/3]
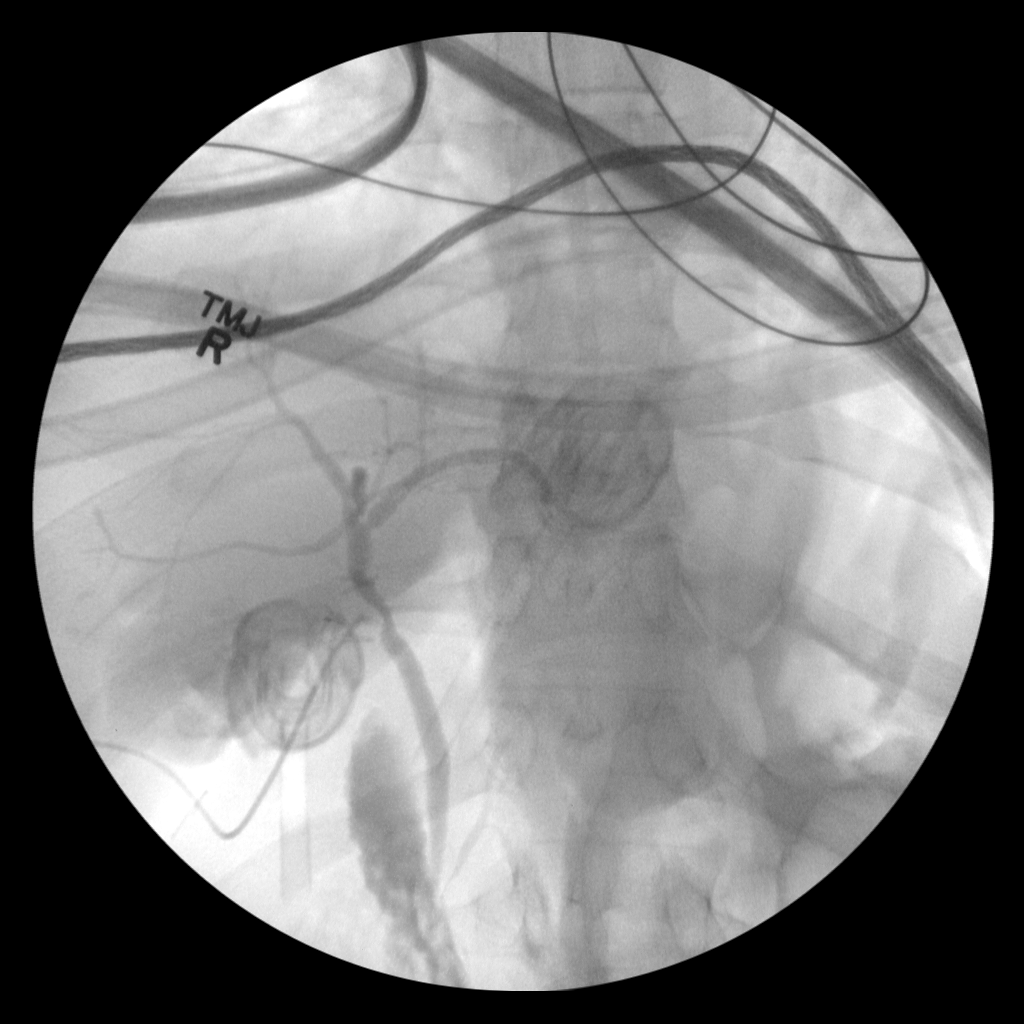
[im 2/3]
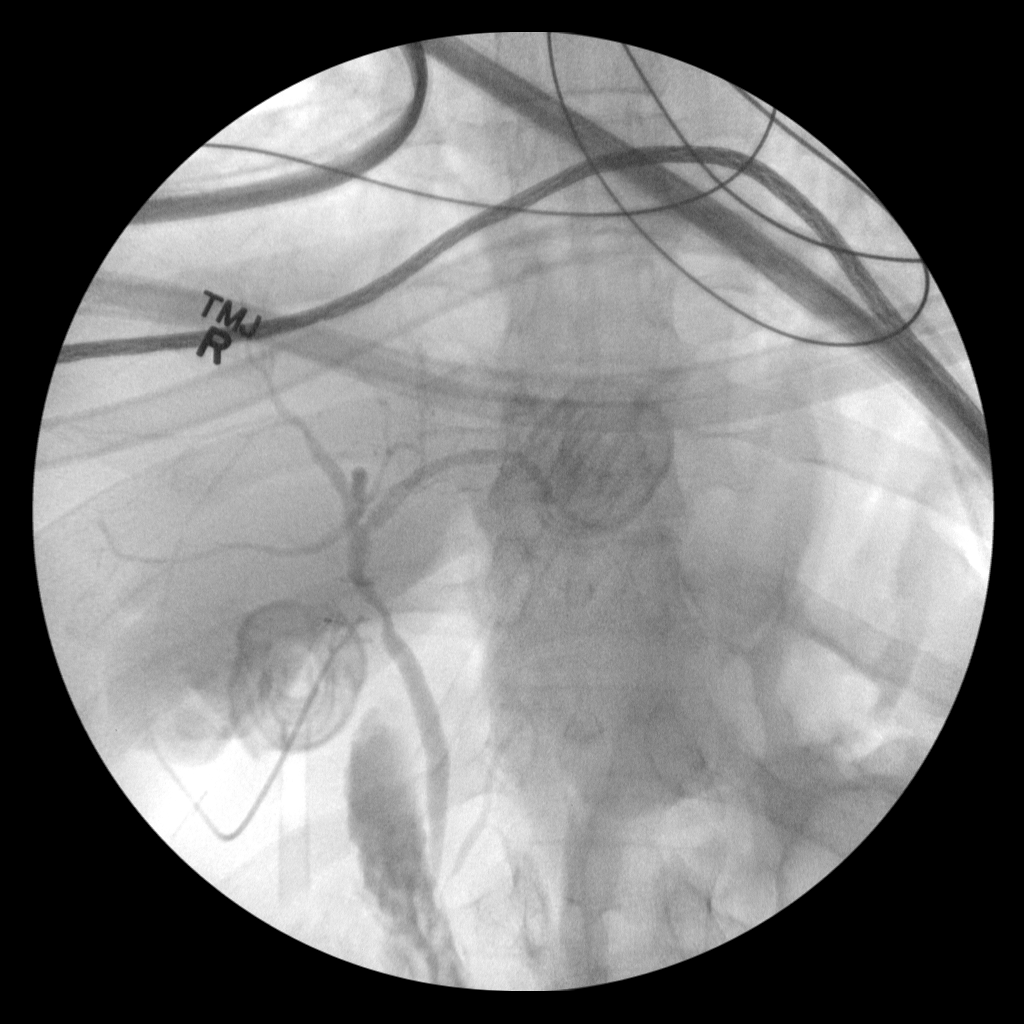
[im 3/3]
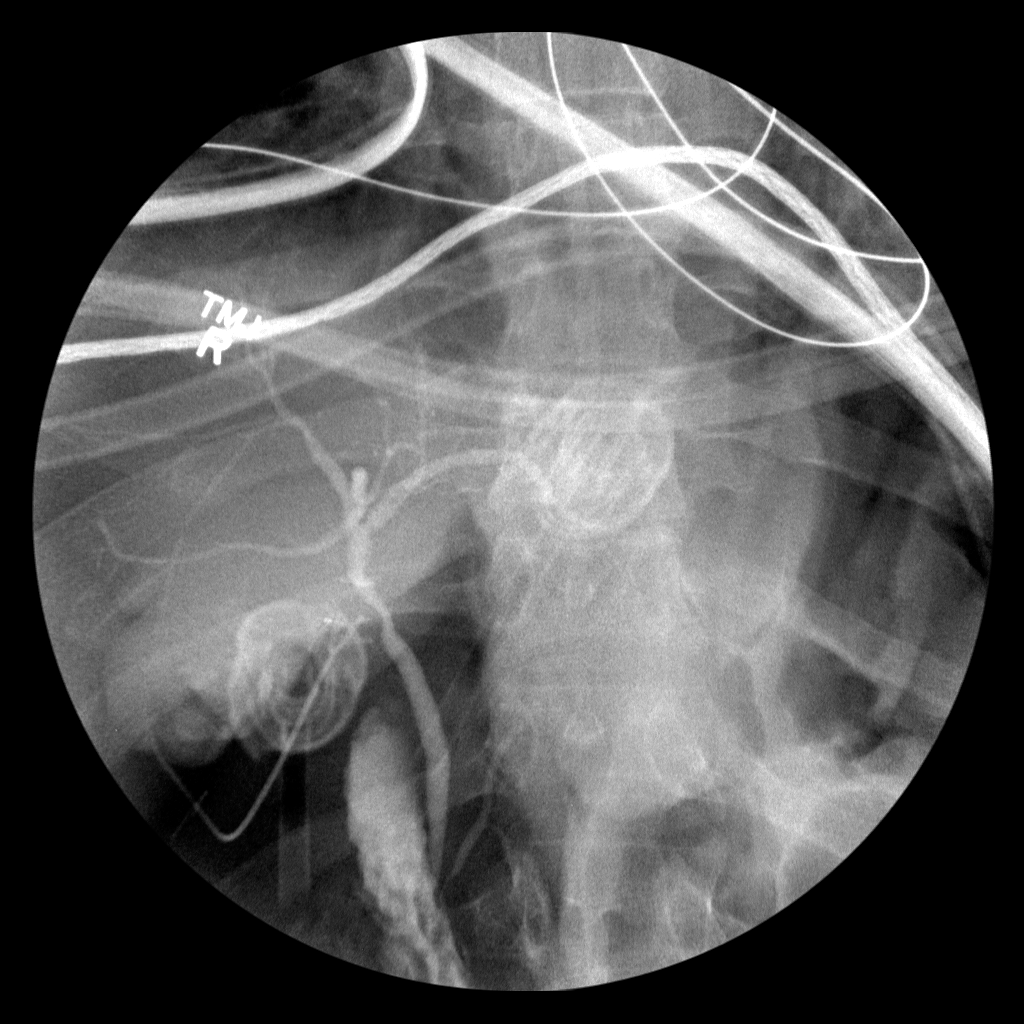

[5 of 5 positions shown; findings below may reference images not displayed]

FINDINGS: Intraoperative cholangiographic images of the right upper abdominal
quadrant during laparoscopic cholecystectomy are provided for
review.

Surgical clips overlie the expected location of the gallbladder
fossa.

Contrast injection demonstrates selective cannulation of the central
aspect of the cystic duct.

There is passage of contrast through the central aspect of the
cystic duct with filling of a non dilated common bile duct. There is
passage of contrast though the CBD and into the descending portion
of the duodenum.

There is minimal reflux of injected contrast into the common hepatic
duct and central aspect of the non dilated intrahepatic biliary
system. There is minimal opacification of the central aspect of the
pancreatic duct which appears nondilated.

There are no discrete filling defects within the opacified portions
of the biliary system to suggest the presence of
choledocholithiasis.
IMPRESSION: No evidence of choledocholithiasis.

## 2018-01-18 ENCOUNTER — Ambulatory Visit: Payer: BLUE CROSS/BLUE SHIELD | Admitting: Internal Medicine

## 2019-10-14 ENCOUNTER — Ambulatory Visit: Payer: Self-pay | Attending: Internal Medicine

## 2019-10-14 DIAGNOSIS — Z23 Encounter for immunization: Secondary | ICD-10-CM

## 2019-10-14 NOTE — Progress Notes (Signed)
   Covid-19 Vaccination Clinic  Name:  Maxwell Herrera    MRN: [id number] DOB: 1985-09-24  10/14/2019  [name] was observed post Covid-19 immunization for 15 minutes without incident. He was provided with Vaccine Information Sheet and instruction to access the V-Safe system.   [name] was instructed to call 911 with any severe reactions post vaccine: Marland Kitchen Difficulty breathing  . Swelling of face and throat  . A fast heartbeat  . A bad rash all over body  . Dizziness and weakness   Immunizations Administered    Name Date Dose VIS Date Route   Pfizer COVID-19 Vaccine 10/14/2019  1:25 PM 0.3 mL 07/15/2019 Intramuscular   Manufacturer: [employer], [employer]   Lot: [id number]   NDC: [address]-7

## 2019-11-07 ENCOUNTER — Ambulatory Visit: Payer: Self-pay

## 2019-11-07 ENCOUNTER — Ambulatory Visit: Payer: Self-pay | Attending: Internal Medicine

## 2019-11-07 DIAGNOSIS — Z23 Encounter for immunization: Secondary | ICD-10-CM

## 2019-11-07 NOTE — Progress Notes (Signed)
   Covid-19 Vaccination Clinic  Name:  Maxwell Herrera    MRN: [id number] DOB: 04/07/1986  11/07/2019  [name] was observed post Covid-19 immunization for 15 minutes without incident. He was provided with Vaccine Information Sheet and instruction to access the V-Safe system.   [name] was instructed to call 911 with any severe reactions post vaccine: Marland Kitchen Difficulty breathing  . Swelling of face and throat  . A fast heartbeat  . A bad rash all over body  . Dizziness and weakness   Immunizations Administered    Name Date Dose VIS Date Route   Pfizer COVID-19 Vaccine 11/07/2019  5:19 PM 0.3 mL 07/15/2019 Intramuscular   Manufacturer: [employer], [employer]   Lot: [id number]   NDC: [address]-0

## 2019-12-05 ENCOUNTER — Telehealth: Payer: Self-pay | Admitting: Internal Medicine

## 2019-12-05 NOTE — Telephone Encounter (Signed)
Ok with me, but let pt know I do not treat chronic pain with narcotic such as hydrocodone or stronger meds, so he would need pain management if this is the case

## 2019-12-05 NOTE — Telephone Encounter (Signed)
  Are you willing to re-establish care with Maxwell Herrera, he was last seen 2015

## 2019-12-07 NOTE — Telephone Encounter (Signed)
Forwarding msg to schedulers to contact pt to make appt to re-establish.Marland KitchenRaechel Chute

## 2019-12-30 ENCOUNTER — Other Ambulatory Visit: Payer: Self-pay

## 2019-12-30 ENCOUNTER — Encounter: Payer: Self-pay | Admitting: Internal Medicine

## 2019-12-30 ENCOUNTER — Ambulatory Visit: Payer: No Typology Code available for payment source | Admitting: Internal Medicine

## 2019-12-30 VITALS — BP 120/80 | HR 105 | Temp 97.6°F | Wt 211.0 lb

## 2019-12-30 DIAGNOSIS — E559 Vitamin D deficiency, unspecified: Secondary | ICD-10-CM

## 2019-12-30 DIAGNOSIS — E538 Deficiency of other specified B group vitamins: Secondary | ICD-10-CM

## 2019-12-30 DIAGNOSIS — Z0000 Encounter for general adult medical examination without abnormal findings: Secondary | ICD-10-CM | POA: Diagnosis not present

## 2019-12-30 MED ORDER — AMPHETAMINE-DEXTROAMPHETAMINE 10 MG PO TABS: 10.0000 mg | ORAL_TABLET | Freq: Two times a day (BID) | ORAL | 0 refills | Status: DC

## 2019-12-30 NOTE — Assessment & Plan Note (Signed)

## 2019-12-30 NOTE — Progress Notes (Signed)
Subjective:    Patient ID: Maxwell Herrera, male    DOB: Oct 16, 1985, 34 y.o.   MRN: [id number]  HPI    Here for wellness and to [place];  Overall doing ok;  Pt denies Chest pain, worsening SOB, DOE, wheezing, orthopnea, PND, worsening LE edema, palpitations, dizziness or syncope.  Pt denies neurological change such as new headache, facial or extremity weakness.  Pt denies polydipsia, polyuria, or low sugar symptoms. Pt states overall good compliance with treatment and medications, good tolerability, and has been trying to follow appropriate diet.  Pt denies worsening depressive symptoms, suicidal ideation or panic. No fever, night sweats, wt loss, loss of appetite, or other constitutional symptoms.  Pt states good ability with ADL's, has low fall risk, home safety reviewed and adequate, no other significant changes in hearing or vision, and only occasionally active with exercise.  Recent depression, no job/no insurance now improved, here to re-establish.  Seeing psychiatry in [place] with antidepressants and adderall.   Past Medical History:  Diagnosis Date  . ADHD (attention deficit hyperactivity disorder) 08/11/2011  . ALLERGIC RHINITIS 03/21/2007  . ALLERGY, HX OF 03/19/2007  . ANXIETY 03/21/2007  . ASTHMA 12/12/2008  . IBS 03/19/2007  . OBESITY 03/19/2007  . SINUSITIS- ACUTE-NOS 12/27/2009   Past Surgical History:  Procedure Laterality Date  . CHOLECYSTECTOMY N/A 09/29/2016   Procedure: LAPAROSCOPIC CHOLECYSTECTOMY WITH INTRAOPERATIVE CHOLANGIOGRAM;  Surgeon: Coralie Keens, MD;  Location: [facility];  Service: General;  Laterality: N/A;  . STOMACH SURGERY     s/p as toddler for malposition  . TONSILLECTOMY      reports that he has never smoked. He has never used smokeless tobacco. He reports that he does not drink alcohol or use drugs. family history includes Asthma in an other family member. Allergies  Allergen Reactions  . Diphenhydramine Hcl Other (See Comments)    Makes him hyper   Current  Outpatient Medications on File Prior to Visit  Medication Sig Dispense Refill  . clonazePAM (KLONOPIN) 1 MG tablet 1/2 - 1 tab by mouth twice per day as needed (Patient taking differently: Take 1 mg by mouth daily as needed for anxiety. ) 60 tablet 1  . escitalopram (LEXAPRO) 20 MG tablet Take 1 tablet (20 mg total) by mouth daily. 90 tablet 3   No current facility-administered medications on file prior to visit.   Review of Systems All otherwise neg per pt    Objective:   Physical Exam BP 120/80 (BP Location: Left Arm, Patient Position: Sitting, Cuff Size: Large)   Pulse (!) 105   Temp 97.6 F (36.4 C) (Oral)   Wt 211 lb (95.7 kg)   SpO2 96%   BMI 30.28 kg/m  VS noted,  Constitutional: Pt appears in NAD HENT: Head: NCAT.  Right Ear: External ear normal.  Left Ear: External ear normal.  Eyes: . Pupils are equal, round, and reactive to light. Conjunctivae and EOM are normal Nose: without d/c or deformity Neck: Neck supple. Gross normal ROM Cardiovascular: Normal rate and regular rhythm.   Pulmonary/Chest: Effort normal and breath sounds without rales or wheezing.  Abd:  Soft, NT, ND, + BS, no organomegaly Neurological: Pt is alert. At baseline orientation, motor grossly intact Skin: Skin is warm. No rashes, other new lesions, no LE edema Psychiatric: Pt behavior is normal without agitation  All otherwise neg per pt Lab Results  Component Value Date   WBC 9.2 09/30/2016   HGB 13.3 09/30/2016   HCT 38.5 (L) 09/30/2016  PLT 249 09/30/2016   GLUCOSE 132 (H) 09/28/2016   CHOL 152 11/19/2011   TRIG 121.0 11/19/2011   HDL 33.00 (L) 11/19/2011   LDLCALC 95 11/19/2011   ALT 286 (H) 09/28/2016   AST 382 (H) 09/28/2016   NA 139 09/28/2016   K 3.8 09/28/2016   CL 106 09/28/2016   CREATININE 1.27 (H) 09/28/2016   BUN 17 09/28/2016   CO2 26 09/28/2016   TSH 2.69 11/19/2011      Assessment & Plan:

## 2019-12-30 NOTE — Patient Instructions (Signed)
Please continue all other medications as before, and refills have been done if requested.  Please have the pharmacy call with any other refills you may need.  Please continue your efforts at being more active, low cholesterol diet, and weight control.  You are otherwise up to date with prevention measures today.  Please keep your appointments with your specialists as you may have planned  Please go to the LAB at the blood drawing area for the tests to be done - [facility]  You will be contacted by phone if any changes need to be made immediately.  Otherwise, you will receive a letter about your results with an explanation, but please check with MyChart first.  Please remember to sign up for MyChart if you have not done so, as this will be important to you in the future with finding out test results, communicating by private email, and scheduling acute appointments online when needed.  Please make an Appointment to return for your 1 year visit, or sooner if needed

## 2020-01-04 ENCOUNTER — Other Ambulatory Visit (INDEPENDENT_AMBULATORY_CARE_PROVIDER_SITE_OTHER): Payer: No Typology Code available for payment source

## 2020-01-04 DIAGNOSIS — E559 Vitamin D deficiency, unspecified: Secondary | ICD-10-CM

## 2020-01-04 DIAGNOSIS — E538 Deficiency of other specified B group vitamins: Secondary | ICD-10-CM

## 2020-01-04 DIAGNOSIS — Z0000 Encounter for general adult medical examination without abnormal findings: Secondary | ICD-10-CM | POA: Diagnosis not present

## 2020-01-04 LAB — CBC WITH DIFFERENTIAL/PLATELET
Basophils Absolute: 0.1 10*3/uL (ref 0.0–0.1)
Basophils Relative: 0.8 % (ref 0.0–3.0)
Eosinophils Absolute: 0.3 10*3/uL (ref 0.0–0.7)
Eosinophils Relative: 3.2 % (ref 0.0–5.0)
HCT: 42.8 % (ref 39.0–52.0)
Hemoglobin: 14.8 g/dL (ref 13.0–17.0)
Lymphocytes Relative: 28.4 % (ref 12.0–46.0)
Lymphs Abs: 2.6 10*3/uL (ref 0.7–4.0)
MCHC: 34.6 g/dL (ref 30.0–36.0)
MCV: 94.6 fl (ref 78.0–100.0)
Monocytes Absolute: 0.9 10*3/uL (ref 0.1–1.0)
Monocytes Relative: 9.5 % (ref 3.0–12.0)
Neutro Abs: 5.3 10*3/uL (ref 1.4–7.7)
Neutrophils Relative %: 58.1 % (ref 43.0–77.0)
Platelets: 279 10*3/uL (ref 150.0–400.0)
RBC: 4.52 Mil/uL (ref 4.22–5.81)
RDW: 12.8 % (ref 11.5–15.5)
WBC: 9.2 10*3/uL (ref 4.0–10.5)

## 2020-01-04 LAB — BASIC METABOLIC PANEL
BUN: 19 mg/dL (ref 6–23)
CO2: 30 mEq/L (ref 19–32)
Calcium: 9.2 mg/dL (ref 8.4–10.5)
Chloride: 104 mEq/L (ref 96–112)
Creatinine, Ser: 1.26 mg/dL (ref 0.40–1.50)
GFR: 66.37 mL/min (ref >60.00)
Glucose, Bld: 108 mg/dL — ABNORMAL HIGH (ref 70–99)
Potassium: 3.8 mEq/L (ref 3.5–5.1)
Sodium: 139 mEq/L (ref 135–145)

## 2020-01-04 LAB — LIPID PANEL
Cholesterol: 167 mg/dL (ref 0–200)
HDL: 32.3 mg/dL — ABNORMAL LOW (ref >39.00)
LDL Cholesterol: 98 mg/dL (ref 0–99)
NonHDL: 134.78
Total CHOL/HDL Ratio: 5
Triglycerides: 185 mg/dL — ABNORMAL HIGH (ref 0.0–149.0)
VLDL: 37 mg/dL (ref 0.0–40.0)

## 2020-01-04 LAB — HEPATIC FUNCTION PANEL
ALT: 27 U/L (ref 0–53)
AST: 15 U/L (ref 0–37)
Albumin: 4.4 g/dL (ref 3.5–5.2)
Alkaline Phosphatase: 54 U/L (ref 39–117)
Bilirubin, Direct: 0.1 mg/dL (ref 0.0–0.3)
Total Bilirubin: 0.5 mg/dL (ref 0.2–1.2)
Total Protein: 7.3 g/dL (ref 6.0–8.3)

## 2020-01-04 LAB — TSH: TSH: 1.93 u[IU]/mL (ref 0.35–4.50)

## 2020-01-04 LAB — VITAMIN B12: Vitamin B-12: 158 pg/mL — ABNORMAL LOW (ref 211–911)

## 2020-01-04 LAB — VITAMIN D 25 HYDROXY (VIT D DEFICIENCY, FRACTURES): VITD: 9.69 ng/mL — ABNORMAL LOW (ref 30.00–100.00)

## 2020-01-05 ENCOUNTER — Other Ambulatory Visit: Payer: Self-pay | Admitting: Internal Medicine

## 2020-01-05 LAB — URINALYSIS, ROUTINE W REFLEX MICROSCOPIC
Bilirubin Urine: NEGATIVE
Ketones, ur: NEGATIVE
Leukocytes,Ua: NEGATIVE
Nitrite: NEGATIVE
RBC / HPF: NONE SEEN (ref 0–?)
Specific Gravity, Urine: >=1.03 — AB (ref 1.000–1.030)
Urine Glucose: NEGATIVE
Urobilinogen, UA: 0.2 (ref 0.0–1.0)
WBC, UA: NONE SEEN (ref 0–?)
pH: 6 (ref 5.0–8.0)

## 2020-01-05 MED ORDER — VITAMIN B-12 1000 MCG PO TABS: 1000.0000 ug | ORAL_TABLET | Freq: Every day | ORAL | 1 refills | Status: AC

## 2020-01-05 MED ORDER — VITAMIN D (ERGOCALCIFEROL) 1.25 MG (50000 UNIT) PO CAPS: 50000.0000 [IU] | ORAL_CAPSULE | ORAL | 0 refills | Status: DC

## 2020-03-25 ENCOUNTER — Other Ambulatory Visit: Payer: Self-pay | Admitting: Internal Medicine

## 2020-03-25 NOTE — Telephone Encounter (Signed)
Please change to OTC Vitamin D3 at 2000 units per day, indefinitely.  

## 2020-06-25 ENCOUNTER — Encounter: Payer: Self-pay | Admitting: Internal Medicine

## 2020-06-25 ENCOUNTER — Ambulatory Visit: Payer: No Typology Code available for payment source | Admitting: Internal Medicine

## 2020-06-25 ENCOUNTER — Other Ambulatory Visit: Payer: Self-pay

## 2020-06-25 VITALS — BP 110/72 | HR 67 | Temp 98.6°F | Wt 207.4 lb

## 2020-06-25 DIAGNOSIS — E559 Vitamin D deficiency, unspecified: Secondary | ICD-10-CM | POA: Diagnosis not present

## 2020-06-25 DIAGNOSIS — E538 Deficiency of other specified B group vitamins: Secondary | ICD-10-CM | POA: Diagnosis not present

## 2020-06-25 DIAGNOSIS — R739 Hyperglycemia, unspecified: Secondary | ICD-10-CM | POA: Insufficient documentation

## 2020-06-25 DIAGNOSIS — R350 Frequency of micturition: Secondary | ICD-10-CM | POA: Diagnosis not present

## 2020-06-25 DIAGNOSIS — R5383 Other fatigue: Secondary | ICD-10-CM | POA: Insufficient documentation

## 2020-06-25 DIAGNOSIS — J309 Allergic rhinitis, unspecified: Secondary | ICD-10-CM | POA: Diagnosis not present

## 2020-06-25 LAB — HEPATIC FUNCTION PANEL
ALT: 19 U/L (ref 0–53)
AST: 14 U/L (ref 0–37)
Albumin: 4.4 g/dL (ref 3.5–5.2)
Alkaline Phosphatase: 51 U/L (ref 39–117)
Bilirubin, Direct: 0.1 mg/dL (ref 0.0–0.3)
Total Bilirubin: 0.5 mg/dL (ref 0.2–1.2)
Total Protein: 7.3 g/dL (ref 6.0–8.3)

## 2020-06-25 LAB — CBC WITH DIFFERENTIAL/PLATELET
Basophils Absolute: 0.1 10*3/uL (ref 0.0–0.1)
Basophils Relative: 0.9 % (ref 0.0–3.0)
Eosinophils Absolute: 0.4 10*3/uL (ref 0.0–0.7)
Eosinophils Relative: 3.9 % (ref 0.0–5.0)
HCT: 42.5 % (ref 39.0–52.0)
Hemoglobin: 14.5 g/dL (ref 13.0–17.0)
Lymphocytes Relative: 25.6 % (ref 12.0–46.0)
Lymphs Abs: 2.5 10*3/uL (ref 0.7–4.0)
MCHC: 34.2 g/dL (ref 30.0–36.0)
MCV: 93.6 fl (ref 78.0–100.0)
Monocytes Absolute: 0.9 10*3/uL (ref 0.1–1.0)
Monocytes Relative: 9.2 % (ref 3.0–12.0)
Neutro Abs: 5.8 10*3/uL (ref 1.4–7.7)
Neutrophils Relative %: 60.4 % (ref 43.0–77.0)
Platelets: 277 10*3/uL (ref 150.0–400.0)
RBC: 4.54 Mil/uL (ref 4.22–5.81)
RDW: 12.9 % (ref 11.5–15.5)
WBC: 9.7 10*3/uL (ref 4.0–10.5)

## 2020-06-25 LAB — URINALYSIS, ROUTINE W REFLEX MICROSCOPIC
Bilirubin Urine: NEGATIVE
Hgb urine dipstick: NEGATIVE
Ketones, ur: NEGATIVE
Leukocytes,Ua: NEGATIVE
Nitrite: NEGATIVE
RBC / HPF: NONE SEEN (ref 0–?)
Specific Gravity, Urine: 1.025 (ref 1.000–1.030)
Total Protein, Urine: NEGATIVE
Urine Glucose: NEGATIVE
Urobilinogen, UA: 0.2 (ref 0.0–1.0)
pH: 6 (ref 5.0–8.0)

## 2020-06-25 LAB — BASIC METABOLIC PANEL
BUN: 20 mg/dL (ref 6–23)
CO2: 28 mEq/L (ref 19–32)
Calcium: 9.2 mg/dL (ref 8.4–10.5)
Chloride: 104 mEq/L (ref 96–112)
Creatinine, Ser: 1.17 mg/dL (ref 0.40–1.50)
GFR: 82.57 mL/min (ref >60.00)
Glucose, Bld: 94 mg/dL (ref 70–99)
Potassium: 4.4 mEq/L (ref 3.5–5.1)
Sodium: 139 mEq/L (ref 135–145)

## 2020-06-25 LAB — TSH: TSH: 3.07 u[IU]/mL (ref 0.35–4.50)

## 2020-06-25 LAB — VITAMIN D 25 HYDROXY (VIT D DEFICIENCY, FRACTURES): VITD: 21.59 ng/mL — ABNORMAL LOW (ref 30.00–100.00)

## 2020-06-25 LAB — HEMOGLOBIN A1C: Hgb A1c MFr Bld: 5.3 % (ref 4.6–6.5)

## 2020-06-25 LAB — VITAMIN B12: Vitamin B-12: 346 pg/mL (ref 211–911)

## 2020-06-25 MED ORDER — DOXYCYCLINE HYCLATE 100 MG PO TABS
100.0000 mg | ORAL_TABLET | Freq: Two times a day (BID) | ORAL | 0 refills | Status: DC
Start: 2020-06-25 — End: 2020-07-30

## 2020-06-25 MED ORDER — FEXOFENADINE HCL 180 MG PO TABS: 180.0000 mg | ORAL_TABLET | Freq: Every day | ORAL | 2 refills | Status: AC

## 2020-06-25 MED ORDER — TRIAMCINOLONE ACETONIDE 55 MCG/ACT NA AERO: 2.0000 | INHALATION_SPRAY | Freq: Every day | NASAL | 12 refills | Status: DC

## 2020-06-25 MED ORDER — DOXYCYCLINE HYCLATE 100 MG PO TABS: 100.0000 mg | ORAL_TABLET | Freq: Two times a day (BID) | ORAL | 0 refills | Status: DC

## 2020-06-25 MED ORDER — FEXOFENADINE HCL 180 MG PO TABS: 180.0000 mg | ORAL_TABLET | Freq: Every day | ORAL | 2 refills | Status: DC

## 2020-06-25 NOTE — Progress Notes (Signed)
Subjective:    Patient ID: Maxwell Herrera, male    DOB: [birth year], 34 y.o.   MRN: [id number]  HPI  Here to f/u; overall doing ok,  Pt denies chest pain, increasing sob or doe, wheezing, orthopnea, PND, increased LE swelling, palpitations, dizziness or syncope.  Pt denies new neurological symptoms such as new headache, or facial or extremity weakness or numbness.  Pt denies polydipsia, polyuria, or low sugar episode.  Pt states overall good compliance with meds, mostly trying to follow appropriate diet, with wt overall stable,  but little exercise however.  Also, Does have several wks ongoing nasal allergy symptoms with clearish congestion, itch and sneezing, without fever, pain, ST, cough, swelling or wheezing  Also Denies urinary symptoms such as dysuria, frequency, urgency, flank pain, hematuria or n/v, fever, chills, except for increased urinary frequency and fatigue worsening in the past few weeks.  Tolerating Vit d and b12 oral Past Medical History:  Diagnosis Date  . ADHD (attention deficit hyperactivity disorder) 08/11/2011  . ALLERGIC RHINITIS 03/21/2007  . ALLERGY, HX OF 03/19/2007  . ANXIETY 03/21/2007  . ASTHMA 12/12/2008  . IBS 03/19/2007  . OBESITY 03/19/2007  . SINUSITIS- ACUTE-NOS 12/27/2009   Past Surgical History:  Procedure Laterality Date  . CHOLECYSTECTOMY N/A 09/29/2016   Procedure: LAPAROSCOPIC CHOLECYSTECTOMY WITH INTRAOPERATIVE CHOLANGIOGRAM;  Surgeon: Abigail Miyamoto, MD;  Location: [facility] OR;  Service: General;  Laterality: N/A;  . STOMACH SURGERY     s/p as toddler for malposition  . TONSILLECTOMY      reports that he has never smoked. He has never used smokeless tobacco. He reports that he does not drink alcohol and does not use drugs. family history includes Asthma in an other family member. Allergies  Allergen Reactions  . Diphenhydramine Hcl Other (See Comments)    Makes him hyper   Current Outpatient Medications on File Prior to Visit  Medication Sig Dispense  Refill  . amphetamine-dextroamphetamine (ADDERALL) 10 MG tablet Take 1 tablet (10 mg total) by mouth 2 (two) times daily. 60 tablet 0  . clonazePAM (KLONOPIN) 1 MG tablet 1/2 - 1 tab by mouth twice per day as needed (Patient taking differently: Take 1 mg by mouth daily as needed for anxiety. ) 60 tablet 1  . escitalopram (LEXAPRO) 20 MG tablet Take 1 tablet (20 mg total) by mouth daily. 90 tablet 3  . vitamin B-12 (CYANOCOBALAMIN) 1000 MCG tablet Take 1 tablet (1,000 mcg total) by mouth daily. 90 tablet 1   No current facility-administered medications on file prior to visit.   Review of Systems All otherwise neg per pt    Objective:   Physical Exam BP 110/72 (BP Location: Left Arm)   Pulse 67   Temp 98.6 F (37 C) (Oral)   Wt 207 lb 6.4 oz (94.1 kg)   SpO2 97%   BMI 29.76 kg/m  VS noted,  Constitutional: Pt appears in NAD HENT: Head: NCAT.  Right Ear: External ear normal.  Left Ear: External ear normal.  Eyes: . Pupils are equal, round, and reactive to light. Conjunctivae and EOM are normal Nose: without d/c or deformity Neck: Neck supple. Gross normal ROM Cardiovascular: Normal rate and regular rhythm.   Pulmonary/Chest: Effort normal and breath sounds without rales or wheezing.  Abd:  Soft, NT, ND, + BS, no organomegaly Neurological: Pt is alert. At baseline orientation, motor grossly intact Skin: Skin is warm. No rashes, other new lesions, no LE edema Psychiatric: Pt behavior is normal without agitation  All otherwise neg per pt Lab Results  Component Value Date   WBC 9.7 06/25/2020   HGB 14.5 06/25/2020   HCT 42.5 06/25/2020   PLT 277.0 06/25/2020   GLUCOSE 94 06/25/2020   CHOL 167 01/04/2020   TRIG 185.0 (H) 01/04/2020   HDL 32.30 (L) 01/04/2020   LDLCALC 98 01/04/2020   ALT 19 06/25/2020   AST 14 06/25/2020   NA 139 06/25/2020   K 4.4 06/25/2020   CL 104 06/25/2020   CREATININE 1.17 06/25/2020   BUN 20 06/25/2020   CO2 28 06/25/2020   TSH 3.07 06/25/2020     HGBA1C 5.3 06/25/2020      Assessment & Plan:

## 2020-06-25 NOTE — Patient Instructions (Signed)
Please take all new medication as prescribed - the antibiotic for possible prostatitis  Please avoid caffiene if possible  Please take all new medication as prescribed - the allegra and nasacort OTC  Please continue all other medications as before, and refills have been done if requested.  Please have the pharmacy call with any other refills you may need.  Please continue your efforts at being more active, low cholesterol diet, and weight control.  Please keep your appointments with your specialists as you may have planned  Please go to the LAB at the blood drawing area for the tests to be done  You will be contacted by phone if any changes need to be made immediately.  Otherwise, you will receive a letter about your results with an explanation, but please check with MyChart first.  Please remember to sign up for MyChart if you have not done so, as this will be important to you in the future with finding out test results, communicating by private email, and scheduling acute appointments online when needed.

## 2020-07-01 ENCOUNTER — Encounter: Payer: Self-pay | Admitting: Internal Medicine

## 2020-07-01 NOTE — Assessment & Plan Note (Addendum)
?   Caffeine vs prostatis vs other, for empiric doxy course, avoid increased fluids, avoid caffeine, for UA and culture  I spent 31 minutes in preparing to see the patient by review of recent labs, imaging and procedures, obtaining and reviewing separately obtained history, communicating with the patient and family or caregiver, ordering medications, tests or procedures, and documenting clinical information in the EHR including the differential Dx, treatment, and any further evaluation and other management of urinary freq, fatigue, vit d and b12 deficiency, hyperglycemia, allergies

## 2020-07-01 NOTE — Assessment & Plan Note (Signed)
Etiology unclear, ? Deconditioning due to wt gain?  For labs as ordered

## 2020-07-01 NOTE — Assessment & Plan Note (Signed)
stable overall by history and exam, recent data reviewed with pt, and pt to continue medical treatment as before,  to f/u any worsening symptoms or concerns  

## 2020-07-01 NOTE — Assessment & Plan Note (Signed)
Cont oral replacement 

## 2020-07-01 NOTE — Assessment & Plan Note (Signed)
Ok for otc allegra and nasacort asd,  to f/u any worsening symptoms or concerns 

## 2020-07-26 ENCOUNTER — Encounter: Payer: Self-pay | Admitting: Internal Medicine

## 2020-07-30 ENCOUNTER — Other Ambulatory Visit: Payer: Self-pay

## 2020-07-30 MED ORDER — CIPROFLOXACIN HCL 500 MG PO TABS: 500.0000 mg | ORAL_TABLET | Freq: Two times a day (BID) | ORAL | 0 refills | Status: AC

## 2020-07-30 MED ORDER — CIPROFLOXACIN HCL 500 MG PO TABS: 500.0000 mg | ORAL_TABLET | Freq: Two times a day (BID) | ORAL | 0 refills | Status: DC

## 2020-07-30 NOTE — Addendum Note (Signed)
Addended by: Corwin Levins on: 07/30/2020 07:57 PM   Modules accepted: Orders

## 2020-11-04 ENCOUNTER — Encounter: Payer: Self-pay | Admitting: Internal Medicine

## 2020-11-04 DIAGNOSIS — R82994 Hypercalciuria: Secondary | ICD-10-CM

## 2021-03-25 ENCOUNTER — Ambulatory Visit: Payer: No Typology Code available for payment source | Admitting: Internal Medicine

## 2021-03-25 ENCOUNTER — Encounter: Payer: Self-pay | Admitting: Internal Medicine

## 2021-03-25 ENCOUNTER — Other Ambulatory Visit: Payer: Self-pay

## 2021-03-25 VITALS — BP 138/90 | HR 85 | Temp 98.4°F | Ht 70.0 in | Wt 204.6 lb

## 2021-03-25 DIAGNOSIS — E559 Vitamin D deficiency, unspecified: Secondary | ICD-10-CM

## 2021-03-25 DIAGNOSIS — K219 Gastro-esophageal reflux disease without esophagitis: Secondary | ICD-10-CM

## 2021-03-25 DIAGNOSIS — Z0001 Encounter for general adult medical examination with abnormal findings: Secondary | ICD-10-CM | POA: Diagnosis not present

## 2021-03-25 DIAGNOSIS — R739 Hyperglycemia, unspecified: Secondary | ICD-10-CM

## 2021-03-25 DIAGNOSIS — E538 Deficiency of other specified B group vitamins: Secondary | ICD-10-CM | POA: Diagnosis not present

## 2021-03-25 DIAGNOSIS — R1310 Dysphagia, unspecified: Secondary | ICD-10-CM

## 2021-03-25 DIAGNOSIS — Z1159 Encounter for screening for other viral diseases: Secondary | ICD-10-CM | POA: Diagnosis not present

## 2021-03-25 LAB — BASIC METABOLIC PANEL
BUN: 17 mg/dL (ref 6–23)
CO2: 29 mEq/L (ref 19–32)
Calcium: 9 mg/dL (ref 8.4–10.5)
Chloride: 102 mEq/L (ref 96–112)
Creatinine, Ser: 1.14 mg/dL (ref 0.40–1.50)
GFR: 84.74 mL/min (ref >60.00)
Glucose, Bld: 88 mg/dL (ref 70–99)
Potassium: 4 mEq/L (ref 3.5–5.1)
Sodium: 138 mEq/L (ref 135–145)

## 2021-03-25 LAB — TSH: TSH: 2.19 u[IU]/mL (ref 0.35–5.50)

## 2021-03-25 LAB — LIPID PANEL
Cholesterol: 151 mg/dL (ref 0–200)
HDL: 37.9 mg/dL — ABNORMAL LOW (ref >39.00)
LDL Cholesterol: 80 mg/dL (ref 0–99)
NonHDL: 113.07
Total CHOL/HDL Ratio: 4
Triglycerides: 163 mg/dL — ABNORMAL HIGH (ref 0.0–149.0)
VLDL: 32.6 mg/dL (ref 0.0–40.0)

## 2021-03-25 LAB — CBC WITH DIFFERENTIAL/PLATELET
Basophils Absolute: 0.1 10*3/uL (ref 0.0–0.1)
Basophils Relative: 1 % (ref 0.0–3.0)
Eosinophils Absolute: 0.3 10*3/uL (ref 0.0–0.7)
Eosinophils Relative: 3.7 % (ref 0.0–5.0)
HCT: 41.1 % (ref 39.0–52.0)
Hemoglobin: 14.1 g/dL (ref 13.0–17.0)
Lymphocytes Relative: 25.5 % (ref 12.0–46.0)
Lymphs Abs: 1.9 10*3/uL (ref 0.7–4.0)
MCHC: 34.4 g/dL (ref 30.0–36.0)
MCV: 93.9 fl (ref 78.0–100.0)
Monocytes Absolute: 0.6 10*3/uL (ref 0.1–1.0)
Monocytes Relative: 8.3 % (ref 3.0–12.0)
Neutro Abs: 4.7 10*3/uL (ref 1.4–7.7)
Neutrophils Relative %: 61.5 % (ref 43.0–77.0)
Platelets: 266 10*3/uL (ref 150.0–400.0)
RBC: 4.38 Mil/uL (ref 4.22–5.81)
RDW: 13.1 % (ref 11.5–15.5)
WBC: 7.6 10*3/uL (ref 4.0–10.5)

## 2021-03-25 LAB — HEPATIC FUNCTION PANEL
ALT: 20 U/L (ref 0–53)
AST: 15 U/L (ref 0–37)
Albumin: 4.2 g/dL (ref 3.5–5.2)
Alkaline Phosphatase: 45 U/L (ref 39–117)
Bilirubin, Direct: 0.1 mg/dL (ref 0.0–0.3)
Total Bilirubin: 0.6 mg/dL (ref 0.2–1.2)
Total Protein: 7.3 g/dL (ref 6.0–8.3)

## 2021-03-25 LAB — URINALYSIS, ROUTINE W REFLEX MICROSCOPIC
Bilirubin Urine: NEGATIVE
Hgb urine dipstick: NEGATIVE
Ketones, ur: NEGATIVE
Leukocytes,Ua: NEGATIVE
Nitrite: NEGATIVE
RBC / HPF: NONE SEEN (ref 0–?)
Specific Gravity, Urine: 1.025 (ref 1.000–1.030)
Total Protein, Urine: NEGATIVE
Urine Glucose: NEGATIVE
Urobilinogen, UA: 0.2 (ref 0.0–1.0)
WBC, UA: NONE SEEN (ref 0–?)
pH: 6 (ref 5.0–8.0)

## 2021-03-25 LAB — VITAMIN B12: Vitamin B-12: 448 pg/mL (ref 211–911)

## 2021-03-25 LAB — VITAMIN D 25 HYDROXY (VIT D DEFICIENCY, FRACTURES): VITD: 26.23 ng/mL — ABNORMAL LOW (ref 30.00–100.00)

## 2021-03-25 LAB — HEMOGLOBIN A1C: Hgb A1c MFr Bld: 5.4 % (ref 4.6–6.5)

## 2021-03-25 MED ORDER — PANTOPRAZOLE SODIUM 40 MG PO TBEC: 40.0000 mg | DELAYED_RELEASE_TABLET | Freq: Every day | ORAL | 3 refills | Status: DC

## 2021-03-25 NOTE — Progress Notes (Signed)
Patient ID: Maxwell Herrera, male   DOB: 01/09/1986, 35 y.o.   MRN: [id number]         Chief Complaint:: wellness exam and Dysphagia (States it's been going for years & has gotten worse in the last couple months.)  , low vit d, hyperglycemia, low b12       HPI:  Maxwell Herrera is a 35 y.o. male here for wellness exam; declines covid booster, flu shot, o/w up to date with preventive referrals and immunizations                        Also now taking vit d.  Also with worsening dysphagia to solids only, sypmtomatic about every other da, had a particularly bad episode 2 wks ago with solid  stuck upperchest, neck level for 30 min, couldn't go up or down but finally threw it up, but breathing ok.  Most episodes not as severe but more frquenct lately.  Pt recalls parent told him last wk he had hd a EGD with dlation for some reason at a very young age, not sure why.  Has lost a few lbs, not clear if related.   Pt denies fever, wt loss, night sweats, loss of appetite, or other constitutional symptoms. Does have occasional to moe freq reflux symptoms better with TUMS and diet, nothing else makes better or worse.  Seeing psychiatry with recent adderall increased to 20 bid, and 2 new medications for depression anxiety but cant recall names for now, and actuallyl stopped both b/c the first caused HA, then the second caused fever it seemed, so none for the past just over 1 wk.   Does also have ongoing faitgue not clearly related,  but tends to get very tired about 1 pm maybe after am adderall wearing off.  Better with more protein in the diet.  Not taking Vit D   Wt Readings from Last 3 Encounters:  03/25/21 204 lb 9.6 oz (92.8 kg)  06/25/20 207 lb 6.4 oz (94.1 kg)  12/30/19 211 lb (95.7 kg)   BP Readings from Last 3 Encounters:  03/25/21 138/90  06/25/20 110/72  12/30/19 120/80   Immunization History  Administered Date(s) Administered   DTP 04/29/1988, 07/04/1988, 09/19/1988, 05/27/1989, 11/30/1992   Hepatitis  B 05/14/1999, 07/12/1999, 11/07/1999   HiB (PRP-OMP) 01/29/1990   MMR 05/27/1989, 11/30/1992   OPV 04/29/1988, 07/04/1988, 05/27/1989, 11/30/1992   PFIZER(Purple Top)SARS-COV-2 Vaccination 10/14/2019, 11/07/2019   Td 02/21/2010   There are no preventive care reminders to display for this patient.     Past Medical History:  Diagnosis Date   ADHD (attention deficit hyperactivity disorder) 08/11/2011   ALLERGIC RHINITIS 03/21/2007   ALLERGY, HX OF 03/19/2007   ANXIETY 03/21/2007   ASTHMA 12/12/2008   IBS 03/19/2007   OBESITY 03/19/2007   SINUSITIS- ACUTE-NOS 12/27/2009   Past Surgical History:  Procedure Laterality Date   CHOLECYSTECTOMY N/A 09/29/2016   Procedure: LAPAROSCOPIC CHOLECYSTECTOMY WITH INTRAOPERATIVE CHOLANGIOGRAM;  Surgeon: Douglas Blackman, MD;  Location: [facility] OR;  Service: General;  Laterality: N/A;   STOMACH SURGERY     s/p as toddler for malposition   TONSILLECTOMY      reports that he has never smoked. He has never used smokeless tobacco. He reports that he does not drink alcohol and does not use drugs. family history includes Asthma in an other family member. Allergies  Allergen Reactions   Diphenhydramine Hcl Other (See Comments)    Makes him hyper     Current Outpatient Medications on File Prior to Visit  Medication Sig Dispense Refill   amphetamine-dextroamphetamine (ADDERALL) 20 MG tablet Take 20 mg by mouth 2 (two) times daily.     clonazePAM (KLONOPIN) 1 MG tablet 1/2 - 1 tab by mouth twice per day as needed (Patient taking differently: Take 1 mg by mouth daily as needed for anxiety.) 60 tablet 1   clonazePAM (KLONOPIN) 1 MG tablet Take 1/2 to 1 tab twice a day as needed     DULoxetine (CYMBALTA) 60 MG capsule Take 2 caps daily     fexofenadine (ALLEGRA) 180 MG tablet Take 1 tablet (180 mg total) by mouth daily. 30 tablet 2   vitamin B-12 (CYANOCOBALAMIN) 1000 MCG tablet Take 1 tablet (1,000 mcg total) by mouth daily. 90 tablet 1   triamcinolone (NASACORT) 55  MCG/ACT AERO nasal inhaler Place 2 sprays into the nose daily. (Patient not taking: Reported on 03/25/2021) 1 each 12   No current facility-administered medications on file prior to visit.        ROS:  All others reviewed and negative.  Objective        PE:  BP 138/90 (BP Location: Right Arm, Patient Position: Sitting, Cuff Size: Normal)   Pulse 85   Temp 98.4 F (36.9 C) (Oral)   Ht 5' 10" (1.778 m)   Wt 204 lb 9.6 oz (92.8 kg)   SpO2 97%   BMI 29.36 kg/m                 Constitutional: Pt appears in NAD               HENT: Head: NCAT.                Right Ear: External ear normal.                 Left Ear: External ear normal.                Eyes: . Pupils are equal, round, and reactive to light. Conjunctivae and EOM are normal               Nose: without d/c or deformity               Neck: Neck supple. Gross normal ROM               Cardiovascular: Normal rate and regular rhythm.                 Pulmonary/Chest: Effort normal and breath sounds without rales or wheezing.                Abd:  Soft, NT, ND, + BS, no organomegaly               Neurological: Pt is alert. At baseline orientation, motor grossly intact               Skin: Skin is warm. No rashes, no other new lesions, LE edema - none               Psychiatric: Pt behavior is normal without agitation   Micro: none  Cardiac tracings I have personally interpreted today:  none  Pertinent Radiological findings (summarize): none   Lab Results  Component Value Date   WBC 7.6 03/25/2021   HGB 14.1 03/25/2021   HCT 41.1 03/25/2021   PLT 266.0 03/25/2021   GLUCOSE 88 03/25/2021   CHOL 151 03/25/2021   TRIG 163.0 (  H) 03/25/2021   HDL 37.90 (L) 03/25/2021   LDLCALC 80 03/25/2021   ALT 20 03/25/2021   AST 15 03/25/2021   NA 138 03/25/2021   K 4.0 03/25/2021   CL 102 03/25/2021   CREATININE 1.14 03/25/2021   BUN 17 03/25/2021   CO2 29 03/25/2021   TSH 2.19 03/25/2021   HGBA1C 5.4 03/25/2021   Assessment/Plan:   Maxwell Herrera is a 35 y.o. White or Caucasian [1] male with  has a past medical history of ADHD (attention deficit hyperactivity disorder) (08/11/2011), ALLERGIC RHINITIS (03/21/2007), ALLERGY, HX OF (03/19/2007), ANXIETY (03/21/2007), ASTHMA (12/12/2008), IBS (03/19/2007), OBESITY (03/19/2007), and SINUSITIS- ACUTE-NOS (12/27/2009).  Vitamin D deficiency Last vitamin D Lab Results  Component Value Date   VD25OH 21.59 (L) 06/25/2020   Low to start oral replacement   Encounter for well adult exam with abnormal findings Age and sex appropriate education and counseling updated with regular exercise and diet Referrals for preventative services - none needed Immunizations addressed - declines covid booster, flu shot Smoking counseling  - none needed Evidence for depression or other mood disorder - none significant Most recent labs reviewed. I have personally reviewed and have noted: 1) the patient's medical and social history 2) The patient's current medications and supplements 3) The patient's height, weight, and BMI have been recorded in the chart   Hyperglycemia Lab Results  Component Value Date   HGBA1C 5.4 03/25/2021   Stable, pt to continue current medical treatment  - diet   Dysphagia Etiology unclear, for protonix 40 qd, and refer GI  B12 deficiency Lab Results  Component Value Date   VITAMINB12 448 03/25/2021   Stable, cont oral replacement - b12 1000 mcg qd  Followup: Return in about 6 months (around 09/25/2021).  James John, MD 03/28/2021 10:37 PM [facility] 

## 2021-03-25 NOTE — Patient Instructions (Addendum)
Please consider the Novavax covid vaccine about October 2022  Please take all new medication as prescribed- the protonix 40 mg per day for reflux and trouble swallowing  Please continue all other medications as before, and refills have been done if requested.  Please have the pharmacy call with any other refills you may need.  Please continue your efforts at being more active, low cholesterol diet, and weight control.  You are otherwise up to date with prevention measures today.  Please keep your appointments with your specialists as you may have planned  You will be contacted regarding the referral for: Gastroenterology  Please go to the LAB at the blood drawing area for the tests to be done  You will be contacted by phone if any changes need to be made immediately.  Otherwise, you will receive a letter about your results with an explanation, but please check with MyChart first.  Please remember to sign up for MyChart if you have not done so, as this will be important to you in the future with finding out test results, communicating by private email, and scheduling acute appointments online when needed.  Please make an Appointment to return in 6 months, or sooner if needed

## 2021-03-25 NOTE — Assessment & Plan Note (Signed)
Last vitamin D Lab Results  Component Value Date   VD25OH 21.59 (L) 06/25/2020   Low to start oral replacement

## 2021-03-26 LAB — HEPATITIS C ANTIBODY
Hepatitis C Ab: NONREACTIVE
SIGNAL TO CUT-OFF: 0.01 (ref <1.00)

## 2021-03-28 ENCOUNTER — Encounter: Payer: Self-pay | Admitting: Internal Medicine

## 2021-03-28 NOTE — Assessment & Plan Note (Signed)
Age and sex appropriate education and counseling updated with regular exercise and diet Referrals for preventative services - none needed Immunizations addressed - declines covid booster, flu shot Smoking counseling  - none needed Evidence for depression or other mood disorder - none significant Most recent labs reviewed. I have personally reviewed and have noted: 1) the patient's medical and social history 2) The patient's current medications and supplements 3) The patient's height, weight, and BMI have been recorded in the chart  

## 2021-03-28 NOTE — Assessment & Plan Note (Signed)
Lab Results  Component Value Date   VITAMINB12 448 03/25/2021   Stable, cont oral replacement - b12 1000 mcg qd

## 2021-03-28 NOTE — Assessment & Plan Note (Signed)
Etiology unclear, for protonix 40 qd, and refer GI

## 2021-03-28 NOTE — Assessment & Plan Note (Signed)
Lab Results  Component Value Date   HGBA1C 5.4 03/25/2021   Stable, pt to continue current medical treatment  - diet

## 2021-04-25 ENCOUNTER — Encounter: Payer: Self-pay | Admitting: Gastroenterology

## 2021-04-25 ENCOUNTER — Other Ambulatory Visit: Payer: Self-pay

## 2021-04-25 ENCOUNTER — Ambulatory Visit (INDEPENDENT_AMBULATORY_CARE_PROVIDER_SITE_OTHER): Payer: No Typology Code available for payment source | Admitting: Gastroenterology

## 2021-04-25 VITALS — BP 102/68 | HR 59 | Ht 70.0 in | Wt 203.0 lb

## 2021-04-25 DIAGNOSIS — K589 Irritable bowel syndrome without diarrhea: Secondary | ICD-10-CM

## 2021-04-25 DIAGNOSIS — R1319 Other dysphagia: Secondary | ICD-10-CM

## 2021-04-25 DIAGNOSIS — K219 Gastro-esophageal reflux disease without esophagitis: Secondary | ICD-10-CM | POA: Diagnosis not present

## 2021-04-25 NOTE — Progress Notes (Signed)
Chief Complaint: Dysphagia, GERD   Referring Provider:     Corwin Levins, MD  HPI:     Maxwell Herrera is a 35 y.o. male with a history of ADHD, depression, anxiety with panic disorder, asthma, vitamin D and B12 deficiency, cholecystectomy 2018, referred to the Gastroenterology Clinic for evaluation of progressive solid food dysphagia.  Symptoms have been occurring over the last year or so, progressively worsening. Points to suprasternal notch. Has had to regurgitate foods out at times.   He actually underwent antireflux surgery in 08/1991 (age 35).  Subsequent EGD on 01/20/1992 was largely unremarkable.  Empiric 36 French Maloney dilation performed without difficulty.  He is o/w unsure of symptoms at that time.  Was seen by his PCM for this issue on 03/25/2021.  Started on Protonix 40 mg/day and referred to GI clinic.  Normal CBC, CMP, TSH.  Has noticed some improvement since starting the Protonix.  Does have intermittent reflux symptoms of heartburn and regurgitation and indigestion.  Sxs for a few years. Improves with Tums and dietary modification.  Worse with supine, bananas. Reflux symptoms also improved since starting Protonix as above.  Separately, he reports a history of IBS, diagnosed over 15 years ago. Well controlled with dietary modification.   No known family history of CRC, GI malignancy, liver disease, pancreatic disease, or IBD.   Endoscopic History: - EGD (01/20/1992): Normal-appearing esophagus, stomach, and duodenum.  36 French Maloney dilator passed without difficulty - Colonoscopy (01/20/1992): Diffuse nodularity of the colon with biopsies obtained (path: Mild nonspecific chronic colitis).  Otherwise normal-appearing.   Past Medical History:  Diagnosis Date   ADHD (attention deficit hyperactivity disorder) 08/11/2011   ALLERGIC RHINITIS 03/21/2007   ALLERGY, HX OF 03/19/2007   ANXIETY 03/21/2007   ASTHMA 12/12/2008   IBS 03/19/2007   OBESITY 03/19/2007    SINUSITIS- ACUTE-NOS 12/27/2009     Past Surgical History:  Procedure Laterality Date   CHOLECYSTECTOMY N/A 09/29/2016   Procedure: LAPAROSCOPIC CHOLECYSTECTOMY WITH INTRAOPERATIVE CHOLANGIOGRAM;  Surgeon: Abigail Miyamoto, MD;  Location: [facility] OR;  Service: General;  Laterality: N/A;   STOMACH SURGERY     s/p as toddler for malposition   TONSILLECTOMY     Family History  Problem Relation Age of Onset   Asthma Other    Social History   Tobacco Use   Smoking status: Never   Smokeless tobacco: Never  Substance Use Topics   Alcohol use: No   Drug use: No   Current Outpatient Medications  Medication Sig Dispense Refill   amphetamine-dextroamphetamine (ADDERALL) 20 MG tablet Take 20 mg by mouth 2 (two) times daily.     clonazePAM (KLONOPIN) 1 MG tablet 1/2 - 1 tab by mouth twice per day as needed (Patient taking differently: Take 1 mg by mouth daily as needed for anxiety.) 60 tablet 1   clonazePAM (KLONOPIN) 1 MG tablet Take 1/2 to 1 tab twice a day as needed     DULoxetine (CYMBALTA) 60 MG capsule Take 2 caps daily     fexofenadine (ALLEGRA) 180 MG tablet Take 1 tablet (180 mg total) by mouth daily. 30 tablet 2   pantoprazole (PROTONIX) 40 MG tablet Take 1 tablet (40 mg total) by mouth daily. 90 tablet 3   triamcinolone (NASACORT) 55 MCG/ACT AERO nasal inhaler Place 2 sprays into the nose daily. (Patient not taking: Reported on 03/25/2021) 1 each 12   vitamin B-12 (CYANOCOBALAMIN) 1000 MCG tablet  Take 1 tablet (1,000 mcg total) by mouth daily. 90 tablet 1   No current facility-administered medications for this visit.   Allergies  Allergen Reactions   Diphenhydramine Hcl Other (See Comments)    Makes him hyper     Review of Systems: All systems reviewed and negative except where noted in HPI.     Physical Exam:    Wt Readings from Last 3 Encounters:  03/25/21 204 lb 9.6 oz (92.8 kg)  06/25/20 207 lb 6.4 oz (94.1 kg)  12/30/19 211 lb (95.7 kg)    There were no vitals  taken for this visit. Constitutional:  Pleasant, in no acute distress. Psychiatric: Normal mood and affect. Behavior is normal. EENT: Pupils normal.  Conjunctivae are normal. No scleral icterus. Neck supple. No cervical LAD. Cardiovascular: Normal rate, regular rhythm. No edema Pulmonary/chest: Effort normal and breath sounds normal. No wheezing, rales or rhonchi. Abdominal: Soft, nondistended, nontender. Bowel sounds active throughout. There are no masses palpable. No hepatomegaly. Neurological: Alert and oriented to person place and time. Skin: Skin is warm and dry. No rashes noted.   ASSESSMENT AND PLAN;   1) Dysphagia 2) GERD - EGD with esophageal dilation and biopsies as appropriate - Evaluate for erosive esophagitis, LES laxity, hiatal hernia time of EGD - Evaluate prior antireflux surgery site at time of EGD - Continue Protonix as currently prescribed - Continue antireflux lifestyle/dietary modifications - Continue to cut food into small pieces, eat small bites, chew food thoroughly and with plenty of liquids to avoid food impaction.  3) IBS - Well-controlled with dietary modifications alone - No change in treatment plan  The indications, risks, and benefits of EGD were explained to the patient in detail. Risks include but are not limited to bleeding, perforation, adverse reaction to medications, and cardiopulmonary compromise. Sequelae include but are not limited to the possibility of surgery, hospitalization, and mortality. The patient verbalized understanding and wished to proceed. All questions answered, referred to scheduler. Further recommendations pending results of the exam.     Shellia Cleverly, DO, FACG  04/25/2021, 8:15 AM   Corwin Levins, MD

## 2021-04-25 NOTE — Patient Instructions (Signed)
If you are age 33 or older, your body mass index should be between 23-30. Your Body mass index is 29.13 kg/m. If this is out of the aforementioned range listed, please consider follow up with your Primary Care Provider.  If you are age 28 or younger, your body mass index should be between 19-25. Your Body mass index is 29.13 kg/m. If this is out of the aformentioned range listed, please consider follow up with your Primary Care Provider.   __________________________________________________________  The [facility] providers would like to encourage you to use [facility] to communicate with providers for non-urgent requests or questions.  Due to long hold times on the telephone, sending your provider a message by [facility] may be a faster and more efficient way to get a response.  Please allow 48 business hours for a response.  Please remember that this is for non-urgent requests.   Due to recent changes in healthcare laws, you may see the results of your imaging and laboratory studies on MyChart before your provider has had a chance to review them.  We understand that in some cases there may be results that are confusing or concerning to you. Not all laboratory results come back in the same time frame and the provider may be waiting for multiple results in order to interpret others.  Please give [place] 48 hours in order for your provider to thoroughly review all the results before contacting the office for clarification of your results.   Thank you for choosing me and [facility].  Vito Cirigliano, D.O.

## 2021-05-01 NOTE — Addendum Note (Signed)
Addended by: Deon Pilling J on: 05/01/2021 03:44 PM   Modules accepted: Orders

## 2021-05-03 ENCOUNTER — Encounter: Payer: No Typology Code available for payment source | Admitting: Gastroenterology

## 2021-05-08 ENCOUNTER — Other Ambulatory Visit: Payer: Self-pay

## 2021-05-08 ENCOUNTER — Encounter: Payer: Self-pay | Admitting: Gastroenterology

## 2021-05-08 ENCOUNTER — Ambulatory Visit (AMBULATORY_SURGERY_CENTER): Payer: No Typology Code available for payment source | Admitting: Gastroenterology

## 2021-05-08 VITALS — BP 110/67 | HR 71 | Temp 96.9°F | Resp 14 | Ht 70.0 in | Wt 200.0 lb

## 2021-05-08 DIAGNOSIS — K2970 Gastritis, unspecified, without bleeding: Secondary | ICD-10-CM

## 2021-05-08 DIAGNOSIS — R1319 Other dysphagia: Secondary | ICD-10-CM

## 2021-05-08 DIAGNOSIS — K219 Gastro-esophageal reflux disease without esophagitis: Secondary | ICD-10-CM | POA: Diagnosis not present

## 2021-05-08 DIAGNOSIS — K449 Diaphragmatic hernia without obstruction or gangrene: Secondary | ICD-10-CM | POA: Diagnosis not present

## 2021-05-08 DIAGNOSIS — K589 Irritable bowel syndrome without diarrhea: Secondary | ICD-10-CM

## 2021-05-08 DIAGNOSIS — K222 Esophageal obstruction: Secondary | ICD-10-CM | POA: Diagnosis not present

## 2021-05-08 DIAGNOSIS — K2950 Unspecified chronic gastritis without bleeding: Secondary | ICD-10-CM | POA: Diagnosis not present

## 2021-05-08 DIAGNOSIS — B9681 Helicobacter pylori [H. pylori] as the cause of diseases classified elsewhere: Secondary | ICD-10-CM | POA: Diagnosis not present

## 2021-05-08 MED ORDER — SODIUM CHLORIDE 0.9 % IV SOLN
500.0000 mL | INTRAVENOUS | Status: AC
Start: 2021-05-08 — End: ?

## 2021-05-08 NOTE — Progress Notes (Signed)
To PACU, VSS. Report to [email or web address] 

## 2021-05-08 NOTE — Progress Notes (Signed)
GASTROENTEROLOGY PROCEDURE H&P NOTE   Primary Care Physician: Corwin Levins, MD    Reason for Procedure:   GERD, dysphagia  Plan:    EGD with biopsies and esophageal dilation  Patient is appropriate for endoscopic procedure(s) in the ambulatory ([facility]) setting.  The nature of the procedure, as well as the risks, benefits, and alternatives were carefully and thoroughly reviewed with the patient. Ample time for discussion and questions allowed. The patient understood, was satisfied, and agreed to proceed.     HPI: Maxwell Herrera is a 35 y.o. male who presents for EGD for evaluation of reflux symptoms and progressive solid food dysphagia.  Patient was most recently seen in the Gastroenterology Clinic on 04/25/2021 by me.  No interval change in medical history since that appointment. Please refer to that note for full details regarding GI history and clinical presentation.   Endoscopic History: - EGD (01/20/1992): Normal-appearing esophagus, stomach, and duodenum.  36 French Maloney dilator passed without difficulty - Colonoscopy (01/20/1992): Diffuse nodularity of the colon with biopsies obtained (path: Mild nonspecific chronic colitis).  Otherwise normal-appearing.  Past Medical History:  Diagnosis Date   ADHD (attention deficit hyperactivity disorder) 08/11/2011   ALLERGIC RHINITIS 03/21/2007   ALLERGY, HX OF 03/19/2007   ANXIETY 03/21/2007   ASTHMA 12/12/2008   IBS 03/19/2007   OBESITY 03/19/2007   SINUSITIS- ACUTE-NOS 12/27/2009    Past Surgical History:  Procedure Laterality Date   CHOLECYSTECTOMY N/A 09/29/2016   Procedure: LAPAROSCOPIC CHOLECYSTECTOMY WITH INTRAOPERATIVE CHOLANGIOGRAM;  Surgeon: Abigail Miyamoto, MD;  Location: [facility] OR;  Service: General;  Laterality: N/A;   STOMACH SURGERY     s/p as toddler for malposition   TONSILLECTOMY      Prior to Admission medications   Medication Sig Start Date End Date Taking? Authorizing Provider  amphetamine-dextroamphetamine  (ADDERALL) 20 MG tablet Take 20 mg by mouth 2 (two) times daily. 02/28/21  Yes [provider]  clonazePAM (KLONOPIN) 1 MG tablet Take 1/2 to 1 tab twice a day as needed 03/11/17  Yes [provider]  DULoxetine (CYMBALTA) 60 MG capsule Take 2 caps daily 01/29/21  Yes [provider]  pantoprazole (PROTONIX) 40 MG tablet Take 1 tablet (40 mg total) by mouth daily. 03/25/21  Yes Corwin Levins, MD  vitamin B-12 (CYANOCOBALAMIN) 1000 MCG tablet Take 1 tablet (1,000 mcg total) by mouth daily. 01/05/20  Yes Corwin Levins, MD  fexofenadine (ALLEGRA) 180 MG tablet Take 1 tablet (180 mg total) by mouth daily. 06/25/20 06/25/21  Corwin Levins, MD    Current Outpatient Medications  Medication Sig Dispense Refill   amphetamine-dextroamphetamine (ADDERALL) 20 MG tablet Take 20 mg by mouth 2 (two) times daily.     clonazePAM (KLONOPIN) 1 MG tablet Take 1/2 to 1 tab twice a day as needed     DULoxetine (CYMBALTA) 60 MG capsule Take 2 caps daily     pantoprazole (PROTONIX) 40 MG tablet Take 1 tablet (40 mg total) by mouth daily. 90 tablet 3   vitamin B-12 (CYANOCOBALAMIN) 1000 MCG tablet Take 1 tablet (1,000 mcg total) by mouth daily. 90 tablet 1   fexofenadine (ALLEGRA) 180 MG tablet Take 1 tablet (180 mg total) by mouth daily. 30 tablet 2   Current Facility-Administered Medications  Medication Dose Route Frequency Provider Last Rate Last Admin   0.9 %  sodium chloride infusion  500 mL Intravenous Continuous Vannie Hochstetler V, DO        Allergies as of 05/08/2021 -  Review Complete 05/08/2021  Allergen Reaction Noted   Diphenhydramine hcl Other (See Comments)     Family History  Problem Relation Age of Onset   Diabetes Mellitus I Mother    Thyroid cancer Mother    Diabetes Father    Asthma Other    Colon cancer Neg Hx    Esophageal cancer Neg Hx    Rectal cancer Neg Hx     Social History   Socioeconomic History   Marital status: Single    Spouse name: Not on file    Number of children: Not on file   Years of education: Not on file   Highest education level: Not on file  Occupational History   Occupation: case Production designer, theatre/television/film  Tobacco Use   Smoking status: Never   Smokeless tobacco: Never  Vaping Use   Vaping Use: Never used  Substance and Sexual Activity   Alcohol use: No   Drug use: No   Sexual activity: Not on file  Other Topics Concern   Not on file  Social History Narrative   Not on file   Social Determinants of Health   Financial Resource Strain: Not on file  Food Insecurity: Not on file  Transportation Needs: Not on file  Physical Activity: Not on file  Stress: Not on file  Social Connections: Not on file  Intimate Partner Violence: Not on file    Physical Exam: Vital signs in last 24 hours: @BP  103/68   Pulse 72   Temp (!) 96.9 F (36.1 C) (Temporal)   Ht 5\' 10"  (1.778 m)   Wt 200 lb (90.7 kg)   SpO2 98%   BMI 28.70 kg/m  GEN: NAD EYE: Sclerae anicteric ENT: MMM CV: Non-tachycardic Pulm: CTA b/l GI: Soft, NT/ND NEURO:  Alert & Oriented x 3   , DO [facility]   05/08/2021 7:54 AM

## 2021-05-08 NOTE — Patient Instructions (Signed)
Please read handouts provided. Continue present medications. Await pathology results. Resume previous diet. Increase Protonix ( pantoprazole ) to 40 mg twice daily for 6 weeks, then reduce back to 40 mg daily.   YOU HAD AN ENDOSCOPIC PROCEDURE TODAY AT THE [facility]:   Refer to the procedure report that was given to you for any specific questions about what was found during the examination.  If the procedure report does not answer your questions, please call your gastroenterologist to clarify.  If you requested that your care partner not be given the details of your procedure findings, then the procedure report has been included in a sealed envelope for you to review at your convenience later.  YOU SHOULD EXPECT: Some feelings of bloating in the abdomen. Passage of more gas than usual.  Walking can help get rid of the air that was put into your GI tract during the procedure and reduce the bloating. If you had a lower endoscopy (such as a colonoscopy or flexible sigmoidoscopy) you may notice spotting of blood in your stool or on the toilet paper. If you underwent a bowel prep for your procedure, you may not have a normal bowel movement for a few days.  Please Note:  You might notice some irritation and congestion in your nose or some drainage.  This is from the oxygen used during your procedure.  There is no need for concern and it should clear up in a day or so.  SYMPTOMS TO REPORT IMMEDIATELY:    Following upper endoscopy (EGD)  Vomiting of blood or coffee ground material  New chest pain or pain under the shoulder blades  Painful or persistently difficult swallowing  New shortness of breath  Fever of 100F or higher  Black, tarry-looking stools  For urgent or emergent issues, a gastroenterologist can be reached at any hour by calling [phone]. Do not use MyChart messaging for urgent concerns.    DIET:  We do recommend a small meal at first, but then you may proceed  to your regular diet.  Drink plenty of fluids but you should avoid alcoholic beverages for 24 hours.  ACTIVITY:  You should plan to take it easy for the rest of today and you should NOT DRIVE or use heavy machinery until tomorrow (because of the sedation medicines used during the test).    FOLLOW UP: Our staff will call the number listed on your records 48-72 hours following your procedure to check on you and address any questions or concerns that you may have regarding the information given to you following your procedure. If we do not reach you, we will leave a message.  We will attempt to reach you two times.  During this call, we will ask if you have developed any symptoms of COVID 19. If you develop any symptoms (ie: fever, flu-like symptoms, shortness of breath, cough etc.) before then, please call [phone].  If you test positive for Covid 19 in the 2 weeks post procedure, please call and report this information to [place].    If any biopsies were taken you will be contacted by phone or by letter within the next 1-3 weeks.  Please call us at [phone] if you have not heard about the biopsies in 3 weeks.    SIGNATURES/CONFIDENTIALITY: You and/or your care partner have signed paperwork which will be entered into your electronic medical record.  These signatures attest to the fact that that the information above on your After Visit Summary  has been reviewed and is understood.  Full responsibility of the confidentiality of this discharge information lies with you and/or your care-partner.  

## 2021-05-08 NOTE — Progress Notes (Signed)
Pt's states no medical or surgical changes since previsit or office visit.  NT-VS

## 2021-05-08 NOTE — Op Note (Signed)
[facility] Patient Name: Maxwell Herrera Procedure Date: 05/08/2021 8:14 AM MRN: [id number] Endoscopist: Doristine Locks , MD Age: 35 Referring MD:  Date of Birth: 1986/07/15 Gender: Male Account #: [id number] Procedure:                Upper GI endoscopy Indications:              Dysphagia, Heartburn, Suspected esophageal reflux                           History of antireflux surgery at the age of 29. Medicines:                Monitored Anesthesia Care Procedure:                Pre-Anesthesia Assessment:                           - Prior to the procedure, a History and Physical                            was performed, and patient medications and                            allergies were reviewed. The patient's tolerance of                            previous anesthesia was also reviewed. The risks                            and benefits of the procedure and the sedation                            options and risks were discussed with the patient.                            All questions were answered, and informed consent                            was obtained. Prior Anticoagulants: The patient has                            taken no previous anticoagulant or antiplatelet                            agents. ASA Grade Assessment: II - A patient with                            mild systemic disease. After reviewing the risks                            and benefits, the patient was deemed in                            satisfactory condition to undergo the procedure.  After obtaining informed consent, the endoscope was                            passed under direct vision. Throughout the                            procedure, the patient's blood pressure, pulse, and                            oxygen saturations were monitored continuously. The                            GIF [id number] #[id number] was introduced through the                            mouth, and  advanced to the second part of duodenum.                            The upper GI endoscopy was accomplished without                            difficulty. The patient tolerated the procedure                            fairly well. Scope In: Scope Out: Findings:                 One benign-appearing, intrinsic mild stenosis was                            found 36 cm from the incisors. This stenosis                            measured 1 cm (in length). The stenosis was                            traversed. A TTS dilator was passed through the                            scope. Dilation with an 06-15-12 mm balloon dilator                            was performed to 13 mm. The dilation site was                            examined and showed mild mucosal disruption                            consistent with appropriate dilation. Estimated                            blood loss was minimal.                           Esophagitis  with no bleeding was found in the lower                            third of the esophagus. Biopsies were taken with a                            cold forceps for histology. Estimated blood loss                            was minimal. The remainder of the esophagus was                            normal appearing, but decreased motility noted on                            this study.                           Diffuse mild inflammation characterized by                            congestion (edema) and erythema was found in the                            gastric fundus, in the gastric body and in the                            gastric antrum. Biopsies were taken with a cold                            forceps for Helicobacter pylori testing. Estimated                            blood loss was minimal.                           Evidence of an anti-reflux surgical site was found                            in the cardia and in the gastric fundus. There was                             an apparent crural defect consistent with sliding                            type hiatal hernia, measuring approximately 3 cm in                            transverse width and 1 cm in axial height.                           The examined duodenum was normal. Complications:            No immediate complications. Estimated  Blood Loss:     Estimated blood loss was minimal. Impression:               - Benign-appearing esophageal stenosis. Dilated                            with 13 mm TTS balloon.                           - Esophagitis with no bleeding. Biopsied.                           - Gastritis. Biopsied.                           - An anti-reflux surgical site was found.                           - Sliding hiatal hernia measuring 3 cm in                            transverse width and 1 cm axial height.                           - Normal examined duodenum. Recommendation:           - Patient has a contact number available for                            emergencies. The signs and symptoms of potential                            delayed complications were discussed with the                            patient. Return to normal activities tomorrow.                            Written discharge instructions were provided to the                            patient.                           - Resume previous diet.                           - Continue present medications.                           - Await pathology results.                           - Perform routine esophageal manometry at                            appointment to be scheduled.                           -  Return to GI clinic at appointment to be                            scheduled.                           - Increase Protonix (pantoprazole) to 40 mg PO BID                            for 6 weeks, then reduce back to 40 mg daily. Doristine Locks, MD 05/08/2021 8:57:28 AM

## 2021-05-08 NOTE — Progress Notes (Signed)
Called to room to assist during endoscopic procedure.  Patient ID and intended procedure confirmed with present staff. Received instructions for my participation in the procedure from the performing physician.  

## 2021-05-09 NOTE — Telephone Encounter (Signed)
This is not uncommon after upper endoscopy.  Conservative management is most appropriate.  Okay to do salt water swish, but otherwise conservative management and time is the appropriate treatment.  Certainly if any escalating issues such as not tolerating p.o. intake, fever, etc., to call back or go to ER if after hours.

## 2021-05-10 ENCOUNTER — Telehealth: Payer: Self-pay | Admitting: *Deleted

## 2021-05-10 ENCOUNTER — Telehealth: Payer: Self-pay

## 2021-05-10 ENCOUNTER — Other Ambulatory Visit: Payer: Self-pay

## 2021-05-10 DIAGNOSIS — R1319 Other dysphagia: Secondary | ICD-10-CM

## 2021-05-10 DIAGNOSIS — K219 Gastro-esophageal reflux disease without esophagitis: Secondary | ICD-10-CM

## 2021-05-10 NOTE — Telephone Encounter (Signed)
Follow up call made. 

## 2021-05-10 NOTE — Telephone Encounter (Signed)
  Follow up Call-  Call back number 05/08/2021  Post procedure Call Back phone  # [phone]  Permission to leave phone message Yes  Some recent data might be hidden     Patient questions:  Do you have a fever, pain , or abdominal swelling? No. Pain Score  0 *  Have you tolerated food without any problems? Yes.    Have you been able to return to your normal activities? Yes.    Do you have any questions about your discharge instructions: Diet   No. Medications  No. Follow up visit  No.  Do you have questions or concerns about your Care? No.  Actions: * If pain score is 4 or above: No action needed, pain <4.Have you developed a fever since your procedure? no  2.   Have you had an respiratory symptoms (SOB or cough) since your procedure? no  3.   Have you tested positive for COVID 19 since your procedure no  4.   Have you had any family members/close contacts diagnosed with the COVID 19 since your procedure?  no   If yes to any of these questions please route to Laverna Peace, RN and Karlton Lemon, RN

## 2021-05-10 NOTE — Telephone Encounter (Signed)
Per 05/08/21 procedure report: - Perform routine esophageal manometry at appt to be scheduled - Return to GI office at appt to be scheduled  Patient has been scheduled for a follow up with Dr. Barron Alvine on Tuesday, 07/09/21 at 8:40 am.  Patient is scheduled for an esophageal manometry on Monday, 08/14/20 at 10:30 am, pt will need to arrive at [facility] at 10 am. Instructions and appt information sent to patient via my chart.

## 2021-05-16 ENCOUNTER — Telehealth: Payer: Self-pay | Admitting: General Surgery

## 2021-05-16 DIAGNOSIS — B9681 Helicobacter pylori [H. pylori] as the cause of diseases classified elsewhere: Secondary | ICD-10-CM

## 2021-05-16 MED ORDER — BISMUTH SUBSALICYLATE 262 MG PO CHEW: 524.0000 mg | CHEWABLE_TABLET | Freq: Four times a day (QID) | ORAL | 0 refills | Status: AC

## 2021-05-16 MED ORDER — METRONIDAZOLE 250 MG PO TABS: 250.0000 mg | ORAL_TABLET | Freq: Four times a day (QID) | ORAL | 0 refills | Status: AC

## 2021-05-16 MED ORDER — DOXYCYCLINE HYCLATE 100 MG PO CAPS: 100.0000 mg | ORAL_CAPSULE | Freq: Two times a day (BID) | ORAL | 0 refills | Status: AC

## 2021-05-16 NOTE — Telephone Encounter (Signed)
-----   Message from [place] V, DO sent at 05/15/2021  3:02 PM EDT ----- The biopsies from the recent upper GI Endoscopy were notable for reflux changes from the esophagus and H. Pylori gastritis, and will plan on treating with quad therapy as below. Please confirm no medication allergies to the prescribed regimen.   1) continue Protonix as prescribed 2) Pepto Bismol 2 tabs (262 mg each) 4 times a day x 14 d 3) Metronidazole 250 mg 4 times a day x 14 d 4) doxycycline 100 mg 2 times a day x 14 d  After 14 days, ok to stop omeprazole.  4 weeks after treatment completed, check H. Pylori stool antigen to confirm eradication (must be off acid suppression therapy)  Dx: H. Pylori gastritis

## 2021-05-16 NOTE — Telephone Encounter (Signed)
Contacted the patient and went over pathology from his recent EGD. Explained  to him I would send the medications to his pharmacy, he would remain on them for 14 days then he will stop all of them including his omeprazole. The patient is to remain off of this medication and 4 weeks later have an h pylori stool antigen test. The patient verbalized understanding and I expressed I would send instructions via MyChart. Verified patiens pharmacy.

## 2021-06-10 NOTE — Telephone Encounter (Signed)
Is it okay for this patient to start back on his PPI even though he has not has his h.pylori stool antigen test?

## 2021-07-09 ENCOUNTER — Other Ambulatory Visit: Payer: Self-pay

## 2021-07-09 ENCOUNTER — Encounter: Payer: Self-pay | Admitting: Gastroenterology

## 2021-07-09 ENCOUNTER — Ambulatory Visit (INDEPENDENT_AMBULATORY_CARE_PROVIDER_SITE_OTHER): Payer: No Typology Code available for payment source | Admitting: Gastroenterology

## 2021-07-09 VITALS — BP 110/70 | HR 65 | Ht 70.0 in | Wt 200.2 lb

## 2021-07-09 DIAGNOSIS — Z8619 Personal history of other infectious and parasitic diseases: Secondary | ICD-10-CM

## 2021-07-09 DIAGNOSIS — K222 Esophageal obstruction: Secondary | ICD-10-CM

## 2021-07-09 DIAGNOSIS — K589 Irritable bowel syndrome without diarrhea: Secondary | ICD-10-CM

## 2021-07-09 DIAGNOSIS — K219 Gastro-esophageal reflux disease without esophagitis: Secondary | ICD-10-CM | POA: Diagnosis not present

## 2021-07-09 MED ORDER — FAMOTIDINE 20 MG PO TABS: 20.0000 mg | ORAL_TABLET | Freq: Every day | ORAL | 3 refills | Status: DC | PRN

## 2021-07-09 NOTE — Patient Instructions (Signed)
If you are age 33 or older, your body mass index should be between 23-30. Your Body mass index is 28.73 kg/m. If this is out of the aforementioned range listed, please consider follow up with your Primary Care Provider.  If you are age 9 or younger, your body mass index should be between 19-25. Your Body mass index is 28.73 kg/m. If this is out of the aformentioned range listed, please consider follow up with your Primary Care Provider.   __________________________________________________________  The [facility] providers would like to encourage you to use [facility] to communicate with providers for non-urgent requests or questions.  Due to long hold times on the telephone, sending your provider a message by [facility] may be a faster and more efficient way to get a response.  Please allow 48 business hours for a response.  Please remember that this is for non-urgent requests.   ____________________________________________________________  I will cancel your Esophageal Manometry for January per Dr Barron Alvine.  We have sent the following medications to your pharmacy for you to pick up at your convenience:  Pepcid 20mg  for breakthrough heartburn as needed.  Thank you for choosing me and [facility].  Vito Cirigliano, D.O.

## 2021-07-09 NOTE — Progress Notes (Signed)
Chief Complaint:    Post procedure follow-up, medication management, GERD  GI History: 35 year old male with a history of ADHD, depression, anxiety with panic disorder, asthma, vitamin D and B12 deficiency, cholecystectomy 2018, initially seen in the GI clinic on 04/25/2021 for evaluation of progressive solid food dysphagia and reflux symptoms.  He actually underwent antireflux surgery in 08/1991 (age 72).  Subsequent EGD on 01/20/1992 was largely unremarkable.  Empiric 36 French Maloney dilation performed without difficulty.  He is o/w unsure of symptoms at that time.  History of intermittent reflux symptoms of heartburn and regurgitation and indigestion.  Sxs for a few years. Improves with Tums and dietary modification.  Worse with supine, bananas.  Separately, he reports a history of IBS, diagnosed over 15 years ago. Well controlled with dietary modification.    No known family history of CRC, GI malignancy, liver disease, pancreatic disease, or IBD.    Endoscopic History: - EGD (01/20/1992): Normal-appearing esophagus, stomach, and duodenum.  36 French Maloney dilator passed without difficulty - Colonoscopy (01/20/1992): Diffuse nodularity of the colon with biopsies obtained (path: Mild nonspecific chronic colitis).  Otherwise normal-appearing. - EGD (05/08/2021, Dr. Barron Alvine): Stricture at 36 cm dilated with 13 mm TTS balloon, esophagitis in the lower esophagus (path: Reflux changes), mild gastritis (path: H. pylori positive), 1 cm x 3 cm sliding hiatal hernia, normal duodenum.  Started Protonix 40 mg BID x6 weeks.  Started on quadruple therapy for H. pylori.  HPI:     Patient is a 35 y.o. male presenting to the Gastroenterology Clinic for postprocedure follow-up.  He reports resolution of dysphagia with recent EGD with esophageal dilation and starting high-dose PPI.  Similarly, reflux symptoms have resolved on current Protonix 40 mg bid.  He has since completed quadruple therapy for H.  pylori gastritis.  Tolerating all PO intake without issue. Has reduced to Protonix 40 mg daily, still with good control of reflux and resolved dysphagia.   Review of systems:     No chest pain, no SOB, no fevers, no urinary sx   Past Medical History:  Diagnosis Date   ADHD (attention deficit hyperactivity disorder) 08/11/2011   ALLERGIC RHINITIS 03/21/2007   ALLERGY, HX OF 03/19/2007   ANXIETY 03/21/2007   ASTHMA 12/12/2008   IBS 03/19/2007   OBESITY 03/19/2007   SINUSITIS- ACUTE-NOS 12/27/2009    Patient's surgical history, family medical history, social history, medications and allergies were all reviewed in Epic    Current Outpatient Medications  Medication Sig Dispense Refill   amphetamine-dextroamphetamine (ADDERALL) 20 MG tablet Take 20 mg by mouth 2 (two) times daily.     clonazePAM (KLONOPIN) 1 MG tablet Take 1/2 to 1 tab twice a day as needed     DULoxetine (CYMBALTA) 60 MG capsule Take 2 caps daily     fexofenadine (ALLEGRA) 180 MG tablet Take 1 tablet (180 mg total) by mouth daily. 30 tablet 2   pantoprazole (PROTONIX) 40 MG tablet Take 1 tablet (40 mg total) by mouth daily. 90 tablet 3   vitamin B-12 (CYANOCOBALAMIN) 1000 MCG tablet Take 1 tablet (1,000 mcg total) by mouth daily. 90 tablet 1   Current Facility-Administered Medications  Medication Dose Route Frequency Provider Last Rate Last Admin   0.9 %  sodium chloride infusion  500 mL Intravenous Continuous Isabela Nardelli V, DO        Physical Exam:     BP 110/70   Pulse 65   Ht 5\' 10"  (1.778 m)   Wt 200 lb  4 oz (90.8 kg)   BMI 28.73 kg/m   GENERAL:  Pleasant male in NAD PSYCH: : Cooperative, normal affect Musculoskeletal:  Normal muscle tone, normal strength NEURO: Alert and oriented x 3, no focal neurologic deficits   IMPRESSION and PLAN:    1) GERD - Overall well controlled with pantoprazole 40 mg/day.  Rare nocturnal breakthrough symptoms, particular with dietary indiscretions -Add Pepcid 20 mg prn  breakthrough nocturnal sxs - Continue antireflux lifestyle/dietary modifications  2) Dysphagia 3) Esophageal stricture - Dysphagia resolved with esophageal dilation along with high-dose PPI - If symptoms recur, plan for EGD with further TTS balloon dilation  4) IBS - Well-controlled  5) H. pylori gastritis - Completed quadruple therapy without issue - Will eventually plan for H. pylori stool testing when patient is comfortable holding PPI  RTC prn          Shatana Saxton V Sura Canul ,DO, FACG 07/09/2021, 9:10 AM

## 2021-08-14 ENCOUNTER — Encounter (HOSPITAL_COMMUNITY): Payer: Self-pay

## 2021-08-14 ENCOUNTER — Ambulatory Visit (HOSPITAL_COMMUNITY): Admit: 2021-08-14 | Payer: No Typology Code available for payment source | Admitting: Gastroenterology

## 2021-08-14 SURGERY — MANOMETRY, ESOPHAGUS
Anesthesia: Choice

## 2021-09-07 ENCOUNTER — Other Ambulatory Visit: Payer: Self-pay | Admitting: Gastroenterology

## 2021-09-25 ENCOUNTER — Encounter: Payer: Self-pay | Admitting: Internal Medicine

## 2021-09-25 ENCOUNTER — Ambulatory Visit: Payer: No Typology Code available for payment source | Admitting: Internal Medicine

## 2021-09-25 ENCOUNTER — Other Ambulatory Visit: Payer: Self-pay

## 2021-09-25 VITALS — BP 118/80 | HR 61 | Resp 18 | Ht 70.0 in | Wt 209.8 lb

## 2021-09-25 DIAGNOSIS — Z0001 Encounter for general adult medical examination with abnormal findings: Secondary | ICD-10-CM | POA: Diagnosis not present

## 2021-09-25 DIAGNOSIS — E559 Vitamin D deficiency, unspecified: Secondary | ICD-10-CM | POA: Diagnosis not present

## 2021-09-25 DIAGNOSIS — F909 Attention-deficit hyperactivity disorder, unspecified type: Secondary | ICD-10-CM | POA: Diagnosis not present

## 2021-09-25 DIAGNOSIS — F411 Generalized anxiety disorder: Secondary | ICD-10-CM

## 2021-09-25 DIAGNOSIS — R739 Hyperglycemia, unspecified: Secondary | ICD-10-CM | POA: Diagnosis not present

## 2021-09-25 DIAGNOSIS — J4520 Mild intermittent asthma, uncomplicated: Secondary | ICD-10-CM

## 2021-09-25 DIAGNOSIS — E538 Deficiency of other specified B group vitamins: Secondary | ICD-10-CM | POA: Diagnosis not present

## 2021-09-25 NOTE — Assessment & Plan Note (Signed)
Lab Results  Component Value Date   HGBA1C 5.4 03/25/2021   Stable, pt to continue current medical treatment  - diet  

## 2021-09-25 NOTE — Assessment & Plan Note (Signed)
Age and sex appropriate education and counseling updated with regular exercise and diet Referrals for preventative services - none needed Immunizations addressed - declines covid booster and shingrix Smoking counseling  - none needed Evidence for depression or other mood disorder - none significant Most recent labs reviewed. I have personally reviewed and have noted: 1) the patient's medical and social history 2) The patient's current medications and supplements 3) The patient's height, weight, and BMI have been recorded in the chart  

## 2021-09-25 NOTE — Patient Instructions (Signed)
Please take OTC Vitamin D3 at 2000 units per day, indefinitely  Please continue all other medications as before, and refills have been done if requested.  Please have the pharmacy call with any other refills you may need.  Please continue your efforts at being more active, low cholesterol diet, and weight control.  You are otherwise up to date with prevention measures today.  Please keep your appointments with your specialists as you may have planned  We can hold on further lab tests today  Please make an Appointment to return for your 1 year visit, or sooner if needed

## 2021-09-25 NOTE — Assessment & Plan Note (Signed)
Lab Results  Component Value Date   VITAMINB12 448 03/25/2021   Stable, cont oral replacement - b12 1000 mcg qd  

## 2021-09-25 NOTE — Assessment & Plan Note (Signed)
Stable, cont current tx adderall

## 2021-09-25 NOTE — Assessment & Plan Note (Signed)
Stable, cont current inhaler prn 

## 2021-09-25 NOTE — Progress Notes (Signed)
Patient ID: Maxwell Herrera, male   DOB: May 26, 1986, 36 y.o.   MRN: [id number]         Chief Complaint:: wellness exam and low Vitamin d, low b12, hyperglycemia, ADD, asthma       HPI:  Maxwell Herrera is a 36 y.o. male here for wellness exam; declines covid booster, flu shot, o/w up to date                        Also not taking Vit d.  Pt denies chest pain, increased sob or doe, wheezing, orthopnea, PND, increased LE swelling, palpitations, dizziness or syncope.   Pt denies polydipsia, polyuria, or new focal neuro s/s.   Pt denies fever, wt loss, night sweats, loss of appetite, or other constitutional symptoms  ADD med working well, in fact got a commendation at work today.  No other new complaints  Wt Readings from Last 3 Encounters:  09/25/21 209 lb 12.8 oz (95.2 kg)  07/09/21 200 lb 4 oz (90.8 kg)  05/08/21 200 lb (90.7 kg)   BP Readings from Last 3 Encounters:  09/25/21 118/80  07/09/21 110/70  05/08/21 110/67   Immunization History  Administered Date(s) Administered   DTP 04/29/1988, 07/04/1988, 09/19/1988, 05/27/1989, 11/30/1992   Hepatitis B 05/14/1999, 07/12/1999, 11/07/1999   HiB (PRP-OMP) 01/29/1990   MMR 05/27/1989, 11/30/1992   OPV 04/29/1988, 07/04/1988, 05/27/1989, 11/30/1992   PFIZER(Purple Top)SARS-COV-2 Vaccination 10/14/2019, 11/07/2019   Td 02/21/2010  There are no preventive care reminders to display for this patient.    Past Medical History:  Diagnosis Date   ADHD (attention deficit hyperactivity disorder) 08/11/2011   ALLERGIC RHINITIS 03/21/2007   ALLERGY, HX OF 03/19/2007   ANXIETY 03/21/2007   ASTHMA 12/12/2008   IBS 03/19/2007   OBESITY 03/19/2007   SINUSITIS- ACUTE-NOS 12/27/2009   Past Surgical History:  Procedure Laterality Date   CHOLECYSTECTOMY N/A 09/29/2016   Procedure: LAPAROSCOPIC CHOLECYSTECTOMY WITH INTRAOPERATIVE CHOLANGIOGRAM;  Surgeon: Coralie Keens, MD;  Location: [facility];  Service: General;  Laterality: N/A;   STOMACH SURGERY     s/p as  toddler for malposition   TONSILLECTOMY      reports that he has never smoked. He has never used smokeless tobacco. He reports that he does not drink alcohol and does not use drugs. family history includes Asthma in an other family member; Diabetes in his father; Diabetes Mellitus I in his mother; Thyroid cancer in his mother. Allergies  Allergen Reactions   Diphenhydramine Hcl Other (See Comments)    Makes him hyper   Current Outpatient Medications on File Prior to Visit  Medication Sig Dispense Refill   amphetamine-dextroamphetamine (ADDERALL) 20 MG tablet Take 20 mg by mouth 2 (two) times daily.     clonazePAM (KLONOPIN) 1 MG tablet Take 1/2 to 1 tab twice a day as needed     DULoxetine (CYMBALTA) 60 MG capsule Take 2 caps daily     famotidine (PEPCID) 20 MG tablet TAKE 1 TABLET (20 MG TOTAL) BY MOUTH DAILY AS NEEDED FOR HEARTBURN OR INDIGESTION. 90 tablet 1   pantoprazole (PROTONIX) 40 MG tablet Take 1 tablet (40 mg total) by mouth daily. 90 tablet 3   vitamin B-12 (CYANOCOBALAMIN) 1000 MCG tablet Take 1 tablet (1,000 mcg total) by mouth daily. 90 tablet 1   fexofenadine (ALLEGRA) 180 MG tablet Take 1 tablet (180 mg total) by mouth daily. 30 tablet 2   Current Facility-Administered Medications on File Prior to Visit  Medication Dose Route Frequency Provider Last Rate Last Admin   0.9 %  sodium chloride infusion  500 mL Intravenous Continuous Cirigliano, Vito V, DO            ROS:  All others reviewed and negative.  Objective        PE:  BP 118/80    Pulse 61    Resp 18    Ht 5' 10"  (1.778 m)    Wt 209 lb 12.8 oz (95.2 kg)    SpO2 97%    BMI 30.10 kg/m                 Constitutional: Pt appears in NAD               HENT: Head: NCAT.                Right Ear: External ear normal.                 Left Ear: External ear normal.                Eyes: . Pupils are equal, round, and reactive to light. Conjunctivae and EOM are normal               Nose: without d/c or deformity                Neck: Neck supple. Gross normal ROM               Cardiovascular: Normal rate and regular rhythm.                 Pulmonary/Chest: Effort normal and breath sounds without rales or wheezing.                Abd:  Soft, NT, ND, + BS, no organomegaly               Neurological: Pt is alert. At baseline orientation, motor grossly intact               Skin: Skin is warm. No rashes, no other new lesions, LE edema - nonr               Psychiatric: Pt behavior is normal without agitation   Micro: none  Cardiac tracings I have personally interpreted today:  none  Pertinent Radiological findings (summarize): none   Lab Results  Component Value Date   WBC 7.6 03/25/2021   HGB 14.1 03/25/2021   HCT 41.1 03/25/2021   PLT 266.0 03/25/2021   GLUCOSE 88 03/25/2021   CHOL 151 03/25/2021   TRIG 163.0 (H) 03/25/2021   HDL 37.90 (L) 03/25/2021   LDLCALC 80 03/25/2021   ALT 20 03/25/2021   AST 15 03/25/2021   NA 138 03/25/2021   K 4.0 03/25/2021   CL 102 03/25/2021   CREATININE 1.14 03/25/2021   BUN 17 03/25/2021   CO2 29 03/25/2021   TSH 2.19 03/25/2021   HGBA1C 5.4 03/25/2021   Assessment/Plan:  Maxwell Herrera is a 36 y.o. White or Caucasian [1] male with  has a past medical history of ADHD (attention deficit hyperactivity disorder) (08/11/2011), ALLERGIC RHINITIS (03/21/2007), ALLERGY, HX OF (03/19/2007), ANXIETY (03/21/2007), ASTHMA (12/12/2008), IBS (03/19/2007), OBESITY (03/19/2007), and SINUSITIS- ACUTE-NOS (12/27/2009).  Encounter for well adult exam with abnormal findings Age and sex appropriate education and counseling updated with regular exercise and diet Referrals for preventative services - none needed Immunizations addressed - declines covid booster and shingrix Smoking  counseling  - none needed Evidence for depression or other mood disorder - none significant Most recent labs reviewed. I have personally reviewed and have noted: 1) the patient's medical and social history 2) The  patient's current medications and supplements 3) The patient's height, weight, and BMI have been recorded in the chart   ADHD (attention deficit hyperactivity disorder) Stable, cont current tx adderall  Anxiety state Stable, cont current klonopin prn  Asthma Stable, cont current inhaler prn  B12 deficiency Lab Results  Component Value Date   VITAMINB12 448 03/25/2021   Stable, cont oral replacement - b12 1000 mcg qd   Hyperglycemia Lab Results  Component Value Date   HGBA1C 5.4 03/25/2021   Stable, pt to continue current medical treatment  - diet   Vitamin D deficiency Last vitamin D Lab Results  Component Value Date   VD25OH 26.23 (L) 03/25/2021   Low, to start oral replacement  Followup: Return in about 1 year (around 09/25/2022).  Cathlean Cower, MD 09/25/2021 7:59 PM [place]

## 2021-09-25 NOTE — Assessment & Plan Note (Signed)
Stable, cont current klonopin prn

## 2021-09-25 NOTE — Assessment & Plan Note (Signed)
Last vitamin D Lab Results  Component Value Date   VD25OH 26.23 (L) 03/25/2021   Low, to start oral replacement

## 2021-11-06 ENCOUNTER — Ambulatory Visit: Payer: No Typology Code available for payment source | Admitting: Internal Medicine

## 2021-11-06 ENCOUNTER — Encounter: Payer: Self-pay | Admitting: Internal Medicine

## 2021-11-06 VITALS — BP 108/70 | HR 65 | Resp 18 | Ht 70.0 in | Wt 206.6 lb

## 2021-11-06 DIAGNOSIS — E559 Vitamin D deficiency, unspecified: Secondary | ICD-10-CM | POA: Diagnosis not present

## 2021-11-06 DIAGNOSIS — Z0001 Encounter for general adult medical examination with abnormal findings: Secondary | ICD-10-CM | POA: Diagnosis not present

## 2021-11-06 DIAGNOSIS — R739 Hyperglycemia, unspecified: Secondary | ICD-10-CM

## 2021-11-06 DIAGNOSIS — K2100 Gastro-esophageal reflux disease with esophagitis, without bleeding: Secondary | ICD-10-CM

## 2021-11-06 DIAGNOSIS — E162 Hypoglycemia, unspecified: Secondary | ICD-10-CM

## 2021-11-06 DIAGNOSIS — L309 Dermatitis, unspecified: Secondary | ICD-10-CM

## 2021-11-06 DIAGNOSIS — L709 Acne, unspecified: Secondary | ICD-10-CM

## 2021-11-06 DIAGNOSIS — E538 Deficiency of other specified B group vitamins: Secondary | ICD-10-CM | POA: Diagnosis not present

## 2021-11-06 LAB — BASIC METABOLIC PANEL
BUN: 22 mg/dL (ref 6–23)
CO2: 28 mEq/L (ref 19–32)
Calcium: 9.8 mg/dL (ref 8.4–10.5)
Chloride: 102 mEq/L (ref 96–112)
Creatinine, Ser: 1.2 mg/dL (ref 0.40–1.50)
GFR: 79.34 mL/min (ref >60.00)
Glucose, Bld: 71 mg/dL (ref 70–99)
Potassium: 3.8 mEq/L (ref 3.5–5.1)
Sodium: 139 mEq/L (ref 135–145)

## 2021-11-06 LAB — URINALYSIS, ROUTINE W REFLEX MICROSCOPIC
Bilirubin Urine: NEGATIVE
Hgb urine dipstick: NEGATIVE
Ketones, ur: NEGATIVE
Leukocytes,Ua: NEGATIVE
Nitrite: NEGATIVE
RBC / HPF: NONE SEEN (ref 0–?)
Specific Gravity, Urine: 1.025 (ref 1.000–1.030)
Urine Glucose: NEGATIVE
Urobilinogen, UA: 0.2 (ref 0.0–1.0)
pH: 6 (ref 5.0–8.0)

## 2021-11-06 LAB — LDL CHOLESTEROL, DIRECT: Direct LDL: 112 mg/dL

## 2021-11-06 LAB — CBC WITH DIFFERENTIAL/PLATELET
Basophils Absolute: 0.1 10*3/uL (ref 0.0–0.1)
Basophils Relative: 0.8 % (ref 0.0–3.0)
Eosinophils Absolute: 0.3 10*3/uL (ref 0.0–0.7)
Eosinophils Relative: 3 % (ref 0.0–5.0)
HCT: 42.9 % (ref 39.0–52.0)
Hemoglobin: 15.1 g/dL (ref 13.0–17.0)
Lymphocytes Relative: 22.5 % (ref 12.0–46.0)
Lymphs Abs: 2 10*3/uL (ref 0.7–4.0)
MCHC: 35.1 g/dL (ref 30.0–36.0)
MCV: 92.8 fl (ref 78.0–100.0)
Monocytes Absolute: 0.8 10*3/uL (ref 0.1–1.0)
Monocytes Relative: 8.6 % (ref 3.0–12.0)
Neutro Abs: 5.8 10*3/uL (ref 1.4–7.7)
Neutrophils Relative %: 65.1 % (ref 43.0–77.0)
Platelets: 272 10*3/uL (ref 150.0–400.0)
RBC: 4.63 Mil/uL (ref 4.22–5.81)
RDW: 12.7 % (ref 11.5–15.5)
WBC: 8.9 10*3/uL (ref 4.0–10.5)

## 2021-11-06 LAB — HEMOGLOBIN A1C: Hgb A1c MFr Bld: 5.4 % (ref 4.6–6.5)

## 2021-11-06 LAB — HEPATIC FUNCTION PANEL
ALT: 17 U/L (ref 0–53)
AST: 15 U/L (ref 0–37)
Albumin: 4.7 g/dL (ref 3.5–5.2)
Alkaline Phosphatase: 62 U/L (ref 39–117)
Bilirubin, Direct: 0.1 mg/dL (ref 0.0–0.3)
Total Bilirubin: 0.8 mg/dL (ref 0.2–1.2)
Total Protein: 7.5 g/dL (ref 6.0–8.3)

## 2021-11-06 LAB — VITAMIN D 25 HYDROXY (VIT D DEFICIENCY, FRACTURES): VITD: 20.07 ng/mL — ABNORMAL LOW (ref 30.00–100.00)

## 2021-11-06 LAB — LIPID PANEL
Cholesterol: 166 mg/dL (ref 0–200)
HDL: 37.7 mg/dL — ABNORMAL LOW (ref >39.00)
NonHDL: 127.91
Total CHOL/HDL Ratio: 4
Triglycerides: 201 mg/dL — ABNORMAL HIGH (ref 0.0–149.0)
VLDL: 40.2 mg/dL — ABNORMAL HIGH (ref 0.0–40.0)

## 2021-11-06 LAB — VITAMIN B12: Vitamin B-12: 495 pg/mL (ref 211–911)

## 2021-11-06 LAB — TSH: TSH: 2.03 u[IU]/mL (ref 0.35–5.50)

## 2021-11-06 MED ORDER — TRIAMCINOLONE ACETONIDE 0.1 % EX CREA: 1.0000 "application " | TOPICAL_CREAM | Freq: Two times a day (BID) | CUTANEOUS | 2 refills | Status: AC

## 2021-11-06 MED ORDER — PANTOPRAZOLE SODIUM 40 MG PO TBEC: 40.0000 mg | DELAYED_RELEASE_TABLET | Freq: Two times a day (BID) | ORAL | 3 refills | Status: DC

## 2021-11-06 NOTE — Progress Notes (Signed)
Patient ID: Maxwell Herrera, male   DOB: 05-Jan-1986, 36 y.o.   MRN: [id number] ? ? ? ?    Chief Complaint: dyspepsia, recennt dysphagia, eczema, and acne ? ?     HPI:  Maxwell Herrera is a 36 y.o. male here to f/u post EGD oct 2022 with GERD with esophagitis, now with worsening symptoms despite PPI daily, and bid pepcid.  Has had mild worsening reflux, but no other abd pain,, n/v, bowel change or blood.  Pt has no recurring dysphagia after EGD with dilation as above.  Also has mild worsening eczema to extremities in the past month with marked itching, but no fever, pain, swelling, other red streaks.  Does also have worsening acne to face in the past month, asks for derm referral.  Pt denies chest pain, increased sob or doe, wheezing, orthopnea, PND, increased LE swelling, palpitations, dizziness or syncope.   Pt denies polydipsia, polyuria, or new focal neuro s/s.  Pt denies fever, wt loss, night sweats, loss of appetite, or other constitutional symptoms   Taking B12 ?      ?Wt Readings from Last 3 Encounters:  ?11/06/21 206 lb 9.6 oz (93.7 kg)  ?09/25/21 209 lb 12.8 oz (95.2 kg)  ?07/09/21 200 lb 4 oz (90.8 kg)  ? ?BP Readings from Last 3 Encounters:  ?11/06/21 108/70  ?09/25/21 118/80  ?07/09/21 110/70  ? ?      ?Past Medical History:  ?Diagnosis Date  ? ADHD (attention deficit hyperactivity disorder) 08/11/2011  ? ALLERGIC RHINITIS 03/21/2007  ? ALLERGY, HX OF 03/19/2007  ? ANXIETY 03/21/2007  ? ASTHMA 12/12/2008  ? IBS 03/19/2007  ? OBESITY 03/19/2007  ? SINUSITIS- ACUTE-NOS 12/27/2009  ? ?Past Surgical History:  ?Procedure Laterality Date  ? CHOLECYSTECTOMY N/A 09/29/2016  ? Procedure: LAPAROSCOPIC CHOLECYSTECTOMY WITH INTRAOPERATIVE CHOLANGIOGRAM;  Surgeon: Maxwell Miyamoto, MD;  Location: [facility];  Service: General;  Laterality: N/A;  ? STOMACH SURGERY    ? s/p as toddler for malposition  ? TONSILLECTOMY    ? ? reports that he has never smoked. He has never used smokeless tobacco. He reports that he does not drink alcohol and  does not use drugs. ?family history includes Asthma in an other family member; Diabetes in his father; Diabetes Mellitus I in his mother; Thyroid cancer in his mother. ?Allergies  ?Allergen Reactions  ? Diphenhydramine Hcl Other (See Comments)  ?  Makes him hyper  ? ?Current Outpatient Medications on File Prior to Visit  ?Medication Sig Dispense Refill  ? amphetamine-dextroamphetamine (ADDERALL) 20 MG tablet Take 20 mg by mouth 2 (two) times daily.    ? clonazePAM (KLONOPIN) 1 MG tablet Take 1/2 to 1 tab twice a day as needed    ? DULoxetine (CYMBALTA) 60 MG capsule Take 2 caps daily    ? vitamin B-12 (CYANOCOBALAMIN) 1000 MCG tablet Take 1 tablet (1,000 mcg total) by mouth daily. 90 tablet 1  ? fexofenadine (ALLEGRA) 180 MG tablet Take 1 tablet (180 mg total) by mouth daily. 30 tablet 2  ? ?Current Facility-Administered Medications on File Prior to Visit  ?Medication Dose Route Frequency Provider Last Rate Last Admin  ? 0.9 %  sodium chloride infusion  500 mL Intravenous Continuous Herrera, Maxwell V, DO      ? ?     ROS:  All others reviewed and negative. ? ?Objective  ? ?     PE:  BP 108/70   Pulse 65   Resp 18   Ht 5'  10" (1.778 m)   Wt 206 lb 9.6 oz (93.7 kg)   SpO2 96%   BMI 29.64 kg/m?  ? ?              Constitutional: Pt appears in NAD ?              HENT: Head: NCAT.  ?              Right Ear: External ear normal.   ?              Left Ear: External ear normal.  ?              Eyes: . Pupils are equal, round, and reactive to light. Conjunctivae and EOM are normal ?              Nose: without d/c or deformity ?              Neck: Neck supple. Gross normal ROM ?              Cardiovascular: Normal rate and regular rhythm.   ?              Pulmonary/Chest: Effort normal and breath sounds without rales or wheezing.  ?              Abd:  Soft, NT, ND, + BS, no organomegaly ?              Neurological: Pt is alert. At baseline orientation, motor grossly intact ?              Skin: Skin is warm. + eczema  rashes to arms, and marked acneform lesions to face, LE edema - none ?              Psychiatric: Pt behavior is normal without agitation  ? ?Micro: none ? ?Cardiac tracings I have personally interpreted today:  none ? ?Pertinent Radiological findings (summarize): none  ? ?Lab Results  ?Component Value Date  ? WBC 8.9 11/06/2021  ? HGB 15.1 11/06/2021  ? HCT 42.9 11/06/2021  ? PLT 272.0 11/06/2021  ? GLUCOSE 71 11/06/2021  ? CHOL 166 11/06/2021  ? TRIG 201.0 (H) 11/06/2021  ? HDL 37.70 (L) 11/06/2021  ? LDLDIRECT 112.0 11/06/2021  ? LDLCALC 80 03/25/2021  ? ALT 17 11/06/2021  ? AST 15 11/06/2021  ? NA 139 11/06/2021  ? K 3.8 11/06/2021  ? CL 102 11/06/2021  ? CREATININE 1.20 11/06/2021  ? BUN 22 11/06/2021  ? CO2 28 11/06/2021  ? TSH 2.03 11/06/2021  ? HGBA1C 5.4 11/06/2021  ? ?Assessment/Plan:  ?Maxwell Herrera is a 36 y.o. White or Caucasian [1] male with  has a past medical history of ADHD (attention deficit hyperactivity disorder) (08/11/2011), ALLERGIC RHINITIS (03/21/2007), ALLERGY, HX OF (03/19/2007), ANXIETY (03/21/2007), ASTHMA (12/12/2008), IBS (03/19/2007), OBESITY (03/19/2007), and SINUSITIS- ACUTE-NOS (12/27/2009). ? ?B12 deficiency ?Lab Results  ?Component Value Date  ? [id number] 495 11/06/2021  ? ?Stable, cont oral replacement - b12 1000 mcg qd ? ? ?Hyperglycemia ?Lab Results  ?Component Value Date  ? HGBA1C 5.4 11/06/2021  ? ?Stable, pt to continue current medical treatment  - diet ? ? ?Vitamin D deficiency ?Last vitamin D ?Lab Results  ?Component Value Date  ? VD25OH 20.07 (L) 11/06/2021  ? ?Low, to start oral replacement ? ? ?Eczema ?Mild flare, for triam cream prn ? ?GERD (gastroesophageal reflux disease) ?Uncontrolled, for increased protonix bid, and d/c pepcid ? ?Acne ?With  increased lesions to face , for derm referral ? ?Followup: Return if symptoms worsen or fail to improve. ? ?Maxwell Barre, MD 11/09/2021 8:01 PM ?[facility] ? Primary Care - [facility] ?Internal Medicine ?

## 2021-11-06 NOTE — Patient Instructions (Signed)
Ok to incresae the protonix to 40 mg twice per day ? ?Ok to stop the pepcid ? ?Please take all new medication as prescribed  - the steroid cream ? ?Please continue all other medications as before, and refills have been done if requested. ? ?Please have the pharmacy call with any other refills you may need. ? ?Please continue your efforts at being more active, low cholesterol diet, and weight control. ? ?Please keep your appointments with your specialists as you may have planned ? ?You will be contacted regarding the referral for: Dermatology ? ? ? ?

## 2021-11-07 LAB — INSULIN, RANDOM: Insulin: 22 u[IU]/mL — ABNORMAL HIGH

## 2021-11-09 ENCOUNTER — Encounter: Payer: Self-pay | Admitting: Internal Medicine

## 2021-11-09 DIAGNOSIS — L309 Dermatitis, unspecified: Secondary | ICD-10-CM | POA: Insufficient documentation

## 2021-11-09 DIAGNOSIS — L709 Acne, unspecified: Secondary | ICD-10-CM | POA: Insufficient documentation

## 2021-11-09 NOTE — Assessment & Plan Note (Signed)
Mild flare, for triam cream prn ?

## 2021-11-09 NOTE — Assessment & Plan Note (Signed)
Lab Results  ?Component Value Date  ? HGBA1C 5.4 11/06/2021  ? ?Stable, pt to continue current medical treatment  - diet ? ?

## 2021-11-09 NOTE — Assessment & Plan Note (Signed)
Last vitamin D ?Lab Results  ?Component Value Date  ? VD25OH 20.07 (L) 11/06/2021  ? ?Low, to start oral replacement ? ?

## 2021-11-09 NOTE — Assessment & Plan Note (Signed)
Uncontrolled, for increased protonix bid, and d/c pepcid ?

## 2021-11-09 NOTE — Assessment & Plan Note (Signed)
Lab Results  ?Component Value Date  ? [id number] 11/06/2021  ? ?Stable, cont oral replacement - b12 1000 mcg qd ? ?

## 2021-11-09 NOTE — Assessment & Plan Note (Signed)
With increased lesions to face , for derm referral ?

## 2022-02-07 ENCOUNTER — Telehealth: Payer: Self-pay | Admitting: Internal Medicine

## 2022-02-07 ENCOUNTER — Other Ambulatory Visit: Payer: Self-pay | Admitting: Internal Medicine

## 2022-02-07 NOTE — Telephone Encounter (Signed)
Please refill as per office routine med refill policy (all routine meds to be refilled for 3 mo or monthly (per pt preference) up to one year from last visit, then month to month grace period for 3 mo, then further med refills will have to be denied) ? ?

## 2022-02-07 NOTE — Telephone Encounter (Signed)
Pt stated having chest pain,and tightness in chest.pt stated clonazapam helped with chest pain. Pt was transferred to team health for further evaluation and instructions.

## 2022-02-19 ENCOUNTER — Ambulatory Visit: Payer: No Typology Code available for payment source | Admitting: Family Medicine

## 2022-02-19 ENCOUNTER — Encounter: Payer: Self-pay | Admitting: Family Medicine

## 2022-02-19 VITALS — BP 110/72 | HR 70 | Temp 97.8°F | Ht 70.0 in | Wt 206.0 lb

## 2022-02-19 DIAGNOSIS — F410 Panic disorder [episodic paroxysmal anxiety] without agoraphobia: Secondary | ICD-10-CM

## 2022-02-19 DIAGNOSIS — F419 Anxiety disorder, unspecified: Secondary | ICD-10-CM

## 2022-02-19 DIAGNOSIS — K2100 Gastro-esophageal reflux disease with esophagitis, without bleeding: Secondary | ICD-10-CM | POA: Diagnosis not present

## 2022-02-19 DIAGNOSIS — R079 Chest pain, unspecified: Secondary | ICD-10-CM | POA: Insufficient documentation

## 2022-02-19 NOTE — Progress Notes (Signed)
Subjective:     Patient ID: Maxwell Herrera, male    DOB: 1986-07-20, 36 y.o.   MRN: [id number]  Chief Complaint  Patient presents with   Anxiety    Past couple of weeks panic attacks have been more prevalent within a past couple days and states it has been causing some chest pressure. Denies pain.     Anxiety     Patient is in today for chest tightness with anxiety and panic attacks. Reports having increased stress over the past 2 weeks.   States chest pain goes away after clonazepam.   No chest pain with activity.   GERD- symptomatic.  Reports eating more spicy food. He does eat and lay down.  Denies alcohol or NSAID use.   States he saw GI and had an EGD with dilation. Taking Protonix 40 mg bid.   Denies fever, chills, dizziness, palpitations, shortness of breath, abdominal pain, N/V/D, urinary symptoms, LE edema.    Dad with HTN and uncle had a heart attack in upper 37s.     Health Maintenance Due  Topic Date Due   COVID-19 Vaccine (3 - Pfizer series) 01/02/2020    Past Medical History:  Diagnosis Date   ADHD (attention deficit hyperactivity disorder) 08/11/2011   ALLERGIC RHINITIS 03/21/2007   ALLERGY, HX OF 03/19/2007   ANXIETY 03/21/2007   ASTHMA 12/12/2008   IBS 03/19/2007   OBESITY 03/19/2007   SINUSITIS- ACUTE-NOS 12/27/2009    Past Surgical History:  Procedure Laterality Date   CHOLECYSTECTOMY N/A 09/29/2016   Procedure: LAPAROSCOPIC CHOLECYSTECTOMY WITH INTRAOPERATIVE CHOLANGIOGRAM;  Surgeon: Abigail Miyamoto, MD;  Location: [facility];  Service: General;  Laterality: N/A;   STOMACH SURGERY     s/p as toddler for malposition   TONSILLECTOMY      Family History  Problem Relation Age of Onset   Diabetes Mellitus I Mother    Thyroid cancer Mother    Diabetes Father    Asthma Other    Colon cancer Neg Hx    Esophageal cancer Neg Hx    Rectal cancer Neg Hx     Social History   Socioeconomic History   Marital status: Single    Spouse name: Not on  file   Number of children: Not on file   Years of education: Not on file   Highest education level: Not on file  Occupational History   Occupation: case Production designer, theatre/television/film  Tobacco Use   Smoking status: Never   Smokeless tobacco: Never  Vaping Use   Vaping Use: Never used  Substance and Sexual Activity   Alcohol use: No   Drug use: No   Sexual activity: Not on file  Other Topics Concern   Not on file  Social History Narrative   Not on file   Social Determinants of Health   Financial Resource Strain: Not on file  Food Insecurity: Not on file  Transportation Needs: Not on file  Physical Activity: Not on file  Stress: Not on file  Social Connections: Not on file  Intimate Partner Violence: Not on file    Outpatient Medications Prior to Visit  Medication Sig Dispense Refill   amphetamine-dextroamphetamine (ADDERALL) 20 MG tablet Take 20 mg by mouth 2 (two) times daily.     clonazePAM (KLONOPIN) 1 MG tablet Take 1/2 to 1 tab twice a day as needed     DULoxetine (CYMBALTA) 60 MG capsule Take 2 caps daily     fexofenadine (ALLEGRA) 180 MG tablet Take 1 tablet (180  mg total) by mouth daily. 30 tablet 2   pantoprazole (PROTONIX) 40 MG tablet TAKE 1 TABLET BY MOUTH EVERY DAY 90 tablet 3   triamcinolone cream (KENALOG) 0.1 % Apply 1 application. topically 2 (two) times daily. 30 g 2   vitamin B-12 (CYANOCOBALAMIN) 1000 MCG tablet Take 1 tablet (1,000 mcg total) by mouth daily. 90 tablet 1   Facility-Administered Medications Prior to Visit  Medication Dose Route Frequency Provider Last Rate Last Admin   0.9 %  sodium chloride infusion  500 mL Intravenous Continuous Cirigliano, Vito V, DO        No Active Allergies  ROS     Objective:    Physical Exam Constitutional:      General: He is not in acute distress.    Appearance: He is not ill-appearing.  HENT:     Mouth/Throat:     Mouth: Mucous membranes are moist.  Eyes:     Conjunctiva/sclera: Conjunctivae normal.     Pupils:  Pupils are equal, round, and reactive to light.  Cardiovascular:     Rate and Rhythm: Normal rate and regular rhythm.     Pulses: Normal pulses.     Heart sounds: Normal heart sounds.  Pulmonary:     Effort: Pulmonary effort is normal.     Breath sounds: Normal breath sounds.  Chest:     Chest wall: Tenderness present.  Skin:    General: Skin is warm and dry.  Neurological:     General: No focal deficit present.     Mental Status: He is alert and oriented to person, place, and time.     Motor: No weakness.     Gait: Gait normal.  Psychiatric:        Mood and Affect: Mood normal.        Behavior: Behavior normal.        Thought Content: Thought content normal.     BP 110/72 (BP Location: Left Arm, Patient Position: Sitting, Cuff Size: Large)   Pulse 70   Temp 97.8 F (36.6 C) (Temporal)   Ht 5\' 10"  (1.778 m)   Wt 206 lb (93.4 kg)   SpO2 98%   BMI 29.56 kg/m  Wt Readings from Last 3 Encounters:  02/19/22 206 lb (93.4 kg)  11/06/21 206 lb 9.6 oz (93.7 kg)  09/25/21 209 lb 12.8 oz (95.2 kg)       Assessment & Plan:   Problem List Items Addressed This Visit       Digestive   GERD (gastroesophageal reflux disease) - Primary    Uncontrolled.  History of EGD with dilation.  Poor diet choices recently along with eating and laying down.  Counseling on lifestyle modifications to help improve GERD.  Avoid NSAIDs.  Continue Protonix twice daily and if symptoms or not improving in the next 2 to 4 weeks, he will follow-up with GI.        Other   Anxiety    Increased stress over the past 2 weeks.  He is under the care of a psychiatrist and taking medications appropriately.  No SI.  Follow-up with PCP and psychiatrist as recommended.      Intermittent chest pain    Related to panic attacks and GERD.  Chest wall tenderness today as well.  No personal or family history of cardiac disease.  Follow-up if worsening or any new symptoms.      Panic attacks    Continue on  medication and seeing psychiatrist as recommended.  I am having Bevelyn Buckles. Hegg maintain his vitamin B-12, fexofenadine, amphetamine-dextroamphetamine, DULoxetine, clonazePAM, triamcinolone cream, and pantoprazole. We will continue to administer sodium chloride.  No orders of the defined types were placed in this encounter.

## 2022-02-19 NOTE — Assessment & Plan Note (Signed)
Uncontrolled.  History of EGD with dilation.  Poor diet choices recently along with eating and laying down.  Counseling on lifestyle modifications to help improve GERD.  Avoid NSAIDs.  Continue Protonix twice daily and if symptoms or not improving in the next 2 to 4 weeks, he will follow-up with GI.

## 2022-02-19 NOTE — Assessment & Plan Note (Signed)
Continue on medication and seeing psychiatrist as recommended.

## 2022-02-19 NOTE — Assessment & Plan Note (Addendum)
Related to panic attacks and GERD.  Chest wall tenderness today as well.  No personal or family history of cardiac disease.  Follow-up if worsening or any new symptoms.

## 2022-02-19 NOTE — Patient Instructions (Signed)
Continue on Protonix twice daily as prescribed.  Avoid eating large meals.  Avoid eating and laying down within 3 hours.  Cut back on soda and spicy foods.  If you continue having issues in the next 2 to 4 weeks with acid reflux then you may need to reach out to your gastroenterologist.  Follow-up with Dr. Jonny Ruiz as recommended.    Food Choices for Gastroesophageal Reflux Disease, Adult When you have gastroesophageal reflux disease (GERD), the foods you eat and your eating habits are very important. Choosing the right foods can help ease your discomfort. Think about working with a food expert (dietitian) to help you make good choices. What are tips for following this plan? Reading food labels Look for foods that are low in saturated fat. Foods that may help with your symptoms include: Foods that have less than 5% of daily value (DV) of fat. Foods that have 0 grams of trans fat. Cooking Do not fry your food. Cook your food by baking, steaming, grilling, or broiling. These are all methods that do not need a lot of fat for cooking. To add flavor, try to use herbs that are low in spice and acidity. Meal planning  Choose healthy foods that are low in fat, such as: Fruits and vegetables. Whole grains. Low-fat dairy products. Lean meats, fish, and poultry. Eat small meals often instead of eating 3 large meals each day. Eat your meals slowly in a place where you are relaxed. Avoid bending over or lying down until 2-3 hours after eating. Limit high-fat foods such as fatty meats or fried foods. Limit your intake of fatty foods, such as oils, butter, and shortening. Avoid the following as told by your doctor: Foods that cause symptoms. These may be different for different people. Keep a food diary to keep track of foods that cause symptoms. Alcohol. Drinking a lot of liquid with meals. Eating meals during the 2-3 hours before bed. Lifestyle Stay at a healthy weight. Ask your doctor what weight  is healthy for you. If you need to lose weight, work with your doctor to do so safely. Exercise for at least 30 minutes on 5 or more days each week, or as told by your doctor. Wear loose-fitting clothes. Do not smoke or use any products that contain nicotine or tobacco. If you need help quitting, ask your doctor. Sleep with the head of your bed higher than your feet. Use a wedge under the mattress or blocks under the bed frame to raise the head of the bed. Chew sugar-free gum after meals. What foods should eat?  Eat a healthy, well-balanced diet of fruits, vegetables, whole grains, low-fat dairy products, lean meats, fish, and poultry. Each person is different. Foods that may cause symptoms in one person may not cause any symptoms in another person. Work with your doctor to find foods that are safe for you. The items listed above may not be a complete list of what you can eat and drink. Contact a food expert for more options. What foods should I avoid? Limiting some of these foods may help in managing the symptoms of GERD. Everyone is different. Talk with a food expert or your doctor to help you find the exact foods to avoid, if any. Fruits Any fruits prepared with added fat. Any fruits that cause symptoms. For some people, this may include citrus fruits, such as oranges, grapefruit, pineapple, and lemons. Vegetables Deep-fried vegetables. [place] fries. Any vegetables prepared with added fat. Any vegetables that cause  symptoms. For some people, this may include tomatoes and tomato products, chili peppers, onions and garlic, and horseradish. Grains Pastries or quick breads with added fat. Meats and other proteins High-fat meats, such as fatty beef or pork, hot dogs, ribs, ham, sausage, salami, and bacon. Fried meat or protein, including fried fish and fried chicken. Nuts and nut butters, in large amounts. Dairy Whole milk and chocolate milk. Sour cream. Cream. Ice cream. Cream cheese.  Milkshakes. Fats and oils Butter. Margarine. Shortening. Ghee. Beverages Coffee and tea, with or without caffeine. Carbonated beverages. Sodas. Energy drinks. Fruit juice made with acidic fruits, such as orange or grapefruit. Tomato juice. Alcoholic drinks. Sweets and desserts Chocolate and cocoa. Donuts. Seasonings and condiments Pepper. Peppermint and spearmint. Added salt. Any condiments, herbs, or seasonings that cause symptoms. For some people, this may include curry, hot sauce, or vinegar-based salad dressings. The items listed above may not be a complete list of what you should not eat and drink. Contact a food expert for more options. Questions to ask your doctor Diet and lifestyle changes are often the first steps that are taken to manage symptoms of GERD. If diet and lifestyle changes do not help, talk with your doctor about taking medicines. Where to find more information International [employer] for Gastrointestinal Disorders: aboutgerd.org Summary When you have GERD, food and lifestyle choices are very important in easing your symptoms. Eat small meals often instead of 3 large meals a day. Eat your meals slowly and in a place where you are relaxed. Avoid bending over or lying down until 2-3 hours after eating. Limit high-fat foods such as fatty meats or fried foods. This information is not intended to replace advice given to you by your health care provider. Make sure you discuss any questions you have with your health care provider. Document Revised: 01/30/2020 Document Reviewed: 01/30/2020 Elsevier Patient Education  2023 [employer].

## 2022-02-19 NOTE — Assessment & Plan Note (Signed)
Increased stress over the past 2 weeks.  He is under the care of a psychiatrist and taking medications appropriately.  No SI.  Follow-up with PCP and psychiatrist as recommended.

## 2022-04-09 ENCOUNTER — Other Ambulatory Visit: Payer: Self-pay | Admitting: Gastroenterology

## 2022-06-16 ENCOUNTER — Ambulatory Visit: Payer: No Typology Code available for payment source | Admitting: Dermatology

## 2022-08-12 ENCOUNTER — Ambulatory Visit: Payer: No Typology Code available for payment source | Admitting: Gastroenterology

## 2022-09-17 ENCOUNTER — Ambulatory Visit: Payer: No Typology Code available for payment source | Admitting: Gastroenterology

## 2022-09-17 ENCOUNTER — Encounter: Payer: Self-pay | Admitting: Gastroenterology

## 2022-09-17 VITALS — BP 110/74 | HR 102 | Ht 70.0 in | Wt 213.0 lb

## 2022-09-17 DIAGNOSIS — K222 Esophageal obstruction: Secondary | ICD-10-CM | POA: Diagnosis not present

## 2022-09-17 DIAGNOSIS — K449 Diaphragmatic hernia without obstruction or gangrene: Secondary | ICD-10-CM

## 2022-09-17 DIAGNOSIS — K589 Irritable bowel syndrome without diarrhea: Secondary | ICD-10-CM

## 2022-09-17 DIAGNOSIS — R1310 Dysphagia, unspecified: Secondary | ICD-10-CM

## 2022-09-17 DIAGNOSIS — R112 Nausea with vomiting, unspecified: Secondary | ICD-10-CM | POA: Diagnosis not present

## 2022-09-17 DIAGNOSIS — K2100 Gastro-esophageal reflux disease with esophagitis, without bleeding: Secondary | ICD-10-CM

## 2022-09-17 NOTE — Patient Instructions (Addendum)
  You have been scheduled for an endoscopy. Please follow written instructions given to you at your visit today. If you use inhalers (even only as needed), please bring them with you on the day of your procedure.    _______________________________________________________  If your blood pressure at your visit was 140/90 or greater, please contact your primary care physician to follow up on this.  _______________________________________________________  If you are age 13 or younger, your body mass index should be between 19-25. Your Body mass index is 30.56 kg/m. If this is out of the aformentioned range listed, please consider follow up with your Primary Care Provider.   __________________________________________________________  The [facility] providers would like to encourage you to use [facility] to communicate with providers for non-urgent requests or questions.  Due to long hold times on the telephone, sending your provider a message by [facility] may be a faster and more efficient way to get a response.  Please allow 48 business hours for a response.  Please remember that this is for non-urgent requests.   Due to recent changes in healthcare laws, you may see the results of your imaging and laboratory studies on MyChart before your provider has had a chance to review them.  We understand that in some cases there may be results that are confusing or concerning to you. Not all laboratory results come back in the same time frame and the provider may be waiting for multiple results in order to interpret others.  Please give [place] 48 hours in order for your provider to thoroughly review all the results before contacting the office for clarification of your results.     Thank you for choosing me and [facility].  Vito Cirigliano, D.O.

## 2022-09-17 NOTE — Progress Notes (Signed)
Chief Complaint:    Nausea, dysphagia  GI History: 37 year old male with a history of ADHD, depression, anxiety with panic disorder, asthma, vitamin D and B12 deficiency, cholecystectomy 2018, initially seen in the GI clinic on 04/25/2021 for evaluation of progressive solid food dysphagia and reflux symptoms.  He actually underwent antireflux surgery in 08/1991 (age 77).  Subsequent EGD on 01/20/1992 was largely unremarkable.  Empiric 36 French Maloney dilation performed without difficulty.  He is o/w unsure of symptoms at that time.  History of dysphagia starting in 2022 with esophageal stricture.  Resolution with EGD with dilation in 05/2021.  History of intermittent reflux symptoms of heartburn and regurgitation and indigestion.  Sxs for a few years. Improves with Tums and dietary modification.  Worse with supine, bananas. - Well-controlled since starting Protonix 40 mg daily  Separately, he reports a history of IBS, diagnosed over 15 years ago. Well controlled with dietary modification.    No known family history of CRC, GI malignancy, liver disease, pancreatic disease, or IBD.    Endoscopic History: - EGD (01/20/1992): Normal-appearing esophagus, stomach, and duodenum.  Valley dilator passed without difficulty - Colonoscopy (01/20/1992): Diffuse nodularity of the colon with biopsies obtained (path: Mild nonspecific chronic colitis).  Otherwise normal-appearing. - EGD (05/08/2021, Dr. Bryan Lemma): Stricture at 36 cm dilated with 13 mm TTS balloon, esophagitis in the lower esophagus (path: Reflux changes), mild gastritis (path: H. pylori positive), 1 cm x 3 cm sliding hiatal hernia, normal duodenum.  Started Protonix 40 mg BID x6 weeks.  Completed quadruple therapy for H. pylori.  HPI:     Patient is a 37 y.o. male presenting to the Gastroenterology Clinic for follow-up.  Last seen by me on 07/09/2021.  Was doing well at that time with resolution of dysphagia with esophageal  dilation.  Completed quadruple therapy for H. pylori gastritis.  Protonix 40 mg daily.  Pepcid 48m prn for breakthrough nocturnal symptoms.  Today, he states dysphagia started to recur about 3-4 months ago and has slowly progressed. Solid food only. Again pointing to anterior neck/suprasternal notch. Recent episode of having to regurgitate food back out.   Separately with nausea w/o emesis over the last several months. Tends to resolve with food. Can occur in the middle of the night. Tried going up on protonix to 40 mg BID w/o much change. Stopped spicy foods.   Reflux well controlled with Protonix 40 mg daily (although on BID for nause as above). No recent breakthrough HB , regurgitation.   Review of systems:     No chest pain, no SOB, no fevers, no urinary sx   Past Medical History:  Diagnosis Date   ADHD (attention deficit hyperactivity disorder) 08/11/2011   ALLERGIC RHINITIS 03/21/2007   ALLERGY, HX OF 03/19/2007   ANXIETY 03/21/2007   ASTHMA 12/12/2008   IBS 03/19/2007   OBESITY 03/19/2007   SINUSITIS- ACUTE-NOS 12/27/2009    Patient's surgical history, family medical history, social history, medications and allergies were all reviewed in Epic    Current Outpatient Medications  Medication Sig Dispense Refill   amphetamine-dextroamphetamine (ADDERALL) 20 MG tablet Take 20 mg by mouth 2 (two) times daily.     clonazePAM (KLONOPIN) 1 MG tablet Take 1/2 to 1 tab twice a day as needed     DULoxetine (CYMBALTA) 60 MG capsule Take 2 caps daily     pantoprazole (PROTONIX) 40 MG tablet TAKE 1 TABLET BY MOUTH EVERY DAY 90 tablet 3   vitamin B-12 (CYANOCOBALAMIN) 1000 MCG  tablet Take 1 tablet (1,000 mcg total) by mouth daily. 90 tablet 1   famotidine (PEPCID) 20 MG tablet TAKE 1 TABLET (20 MG TOTAL) BY MOUTH DAILY AS NEEDED FOR HEARTBURN OR INDIGESTION. 90 tablet 1   fexofenadine (ALLEGRA) 180 MG tablet Take 1 tablet (180 mg total) by mouth daily. 30 tablet 2   triamcinolone cream (KENALOG) 0.1  % Apply 1 application. topically 2 (two) times daily. (Patient not taking: Reported on 09/17/2022) 30 g 2   Current Facility-Administered Medications  Medication Dose Route Frequency Provider Last Rate Last Admin   0.9 %  sodium chloride infusion  500 mL Intravenous Continuous Emmalee Solivan V, DO        Physical Exam:     BP 110/74   Pulse (!) 102   Ht 5' 10"$  (1.778 m)   Wt 213 lb (96.6 kg)   BMI 30.56 kg/m   GENERAL:  Pleasant male in NAD PSYCH: : Cooperative, normal affect Musculoskeletal:  Normal muscle tone, normal strength NEURO: Alert and oriented x 3, no focal neurologic deficits   IMPRESSION and PLAN:    1) Dysphagia 2) Esophageal stricture EGD 05/2021 with stricture in the lower esophagus, responsive to 13 mm TTS balloon dilation resolution of symptoms.  Symptoms have started to recur over the last 4 months or so. - Repeat EGD with esophageal dilation and/or biopsies as appropriate - Continue cutting food into small pieces, chew thoroughly, and drink plenty of fluids with meals  3) GERD with erosive esophagitis 4) Hiatal hernia Reflux symptoms otherwise well-controlled with Protonix 40 mg daily. - Evaluate for resolution of previously noted erosive esophagitis at time of EGD as above - Continue PPI - Will stay with bid PPI dosing for now (was only increased to try to treat nausea), but plan to daily dosing pending EGD findings  5) Nausea without emesis - Evaluate for mucosal/luminal pathology time EGD as above - Ensure resolution of previously treated H. pylori gastritis with EGD - Possibly medication ADR?  6) IBS - Well-controlled with dietary modifications alone  The indications, risks, and benefits of EGD were explained to the patient in detail. Risks include but are not limited to bleeding, perforation, adverse reaction to medications, and cardiopulmonary compromise. Sequelae include but are not limited to the possibility of surgery, hospitalization, and  mortality. The patient verbalized understanding and wished to proceed. All questions answered, referred to scheduler. Further recommendations pending results of the exam.        Lavena Bullion ,DO, FACG 09/17/2022, 3:51 PM

## 2022-09-26 ENCOUNTER — Encounter: Payer: No Typology Code available for payment source | Admitting: Internal Medicine

## 2022-10-08 ENCOUNTER — Encounter: Payer: Self-pay | Admitting: Gastroenterology

## 2022-10-08 ENCOUNTER — Ambulatory Visit: Payer: No Typology Code available for payment source | Admitting: Gastroenterology

## 2022-10-08 VITALS — BP 102/64 | HR 57 | Temp 97.8°F | Resp 13 | Ht 70.0 in | Wt 213.0 lb

## 2022-10-08 DIAGNOSIS — K2100 Gastro-esophageal reflux disease with esophagitis, without bleeding: Secondary | ICD-10-CM | POA: Diagnosis not present

## 2022-10-08 DIAGNOSIS — K449 Diaphragmatic hernia without obstruction or gangrene: Secondary | ICD-10-CM

## 2022-10-08 DIAGNOSIS — R112 Nausea with vomiting, unspecified: Secondary | ICD-10-CM

## 2022-10-08 DIAGNOSIS — K229 Disease of esophagus, unspecified: Secondary | ICD-10-CM

## 2022-10-08 DIAGNOSIS — R1310 Dysphagia, unspecified: Secondary | ICD-10-CM

## 2022-10-08 DIAGNOSIS — K222 Esophageal obstruction: Secondary | ICD-10-CM | POA: Diagnosis not present

## 2022-10-08 DIAGNOSIS — K2270 Barrett's esophagus without dysplasia: Secondary | ICD-10-CM

## 2022-10-08 MED ORDER — SODIUM CHLORIDE 0.9 % IV SOLN: 500.0000 mL | Freq: Once | INTRAVENOUS | Status: DC

## 2022-10-08 NOTE — Progress Notes (Unsigned)
GASTROENTEROLOGY PROCEDURE H&P NOTE   Primary Care Physician: Biagio Borg, MD    Reason for Procedure:  Nausea, dysphagia, GERD, esophageal stricture  Plan:    EGD with esophageal dilation  Patient is appropriate for endoscopic procedure(s) in the ambulatory ([facility]) setting.  The nature of the procedure, as well as the risks, benefits, and alternatives were carefully and thoroughly reviewed with the patient. Ample time for discussion and questions allowed. The patient understood, was satisfied, and agreed to proceed.     HPI: Maxwell Herrera is a 37 y.o. male who presents for EGD for evaluation of dysphagia, nausea, known history of esophageal stricture and GERD.  Patient was most recently seen in the Gastroenterology Clinic on 09/17/2022 by me.  No interval change in medical history since that appointment. Please refer to that note for full details regarding GI history and clinical presentation.   Past Medical History:  Diagnosis Date   ADHD (attention deficit hyperactivity disorder) 08/11/2011   ALLERGIC RHINITIS 03/21/2007   ALLERGY, HX OF 03/19/2007   ANXIETY 03/21/2007   ASTHMA 12/12/2008   IBS 03/19/2007   OBESITY 03/19/2007   SINUSITIS- ACUTE-NOS 12/27/2009    Past Surgical History:  Procedure Laterality Date   CHOLECYSTECTOMY N/A 09/29/2016   Procedure: LAPAROSCOPIC CHOLECYSTECTOMY WITH INTRAOPERATIVE CHOLANGIOGRAM;  Surgeon: Coralie Keens, MD;  Location: [facility];  Service: General;  Laterality: N/A;   STOMACH SURGERY     s/p as toddler for malposition   TONSILLECTOMY      Prior to Admission medications   Medication Sig Start Date End Date Taking? Authorizing Provider  amphetamine-dextroamphetamine (ADDERALL) 20 MG tablet Take 20 mg by mouth 2 (two) times daily. 02/28/21  Yes [provider]  clonazePAM (KLONOPIN) 1 MG tablet Take 1/2 to 1 tab twice a day as needed 03/11/17  Yes [provider]  DULoxetine (CYMBALTA) 60 MG capsule Take 2 caps daily 01/29/21   Yes [provider]  famotidine (PEPCID) 20 MG tablet Take by mouth. 04/09/22  Yes [provider]  lamoTRIgine (LAMICTAL) 100 MG tablet Indications: psychiatric disorder 04/04/22  Yes [provider]  pantoprazole (PROTONIX) 40 MG tablet TAKE 1 TABLET BY MOUTH EVERY DAY 02/07/22  Yes Biagio Borg, MD  vitamin B-12 (CYANOCOBALAMIN) 1000 MCG tablet Take 1 tablet (1,000 mcg total) by mouth daily. 01/05/20  Yes Biagio Borg, MD  famotidine (PEPCID) 20 MG tablet TAKE 1 TABLET (20 MG TOTAL) BY MOUTH DAILY AS NEEDED FOR HEARTBURN OR INDIGESTION. 04/09/22 05/09/22  Gionni Freese V, DO  fexofenadine (ALLEGRA) 180 MG tablet Take 1 tablet (180 mg total) by mouth daily. 06/25/20 02/19/22  Biagio Borg, MD  triamcinolone cream (KENALOG) 0.1 % Apply 1 application. topically 2 (two) times daily. Patient not taking: Reported on 09/17/2022 11/06/21 11/06/22  Biagio Borg, MD    Current Outpatient Medications  Medication Sig Dispense Refill   amphetamine-dextroamphetamine (ADDERALL) 20 MG tablet Take 20 mg by mouth 2 (two) times daily.     clonazePAM (KLONOPIN) 1 MG tablet Take 1/2 to 1 tab twice a day as needed     DULoxetine (CYMBALTA) 60 MG capsule Take 2 caps daily     famotidine (PEPCID) 20 MG tablet Take by mouth.     lamoTRIgine (LAMICTAL) 100 MG tablet Indications: psychiatric disorder     pantoprazole (PROTONIX) 40 MG tablet TAKE 1 TABLET BY MOUTH EVERY DAY 90 tablet 3   vitamin B-12 (CYANOCOBALAMIN) 1000 MCG tablet Take 1 tablet (1,000 mcg total)  by mouth daily. 90 tablet 1   famotidine (PEPCID) 20 MG tablet TAKE 1 TABLET (20 MG TOTAL) BY MOUTH DAILY AS NEEDED FOR HEARTBURN OR INDIGESTION. 90 tablet 1   fexofenadine (ALLEGRA) 180 MG tablet Take 1 tablet (180 mg total) by mouth daily. 30 tablet 2   triamcinolone cream (KENALOG) 0.1 % Apply 1 application. topically 2 (two) times daily. (Patient not taking: Reported on 09/17/2022) 30 g 2   Current Facility-Administered Medications   Medication Dose Route Frequency Provider Last Rate Last Admin   0.9 %  sodium chloride infusion  500 mL Intravenous Continuous Tonie Vizcarrondo V, DO       0.9 %  sodium chloride infusion  500 mL Intravenous Once Brady Schiller V, DO        Allergies as of 10/08/2022   (No Active Allergies)    Family History  Problem Relation Age of Onset   Diabetes Mellitus I Mother    Thyroid cancer Mother    Diabetes Father    Asthma Other    Colon cancer Neg Hx    Esophageal cancer Neg Hx    Rectal cancer Neg Hx     Social History   Socioeconomic History   Marital status: Single    Spouse name: Not on file   Number of children: Not on file   Years of education: Not on file   Highest education level: Not on file  Occupational History   Occupation: case Freight forwarder  Tobacco Use   Smoking status: Never   Smokeless tobacco: Never  Vaping Use   Vaping Use: Never used  Substance and Sexual Activity   Alcohol use: No   Drug use: No   Sexual activity: Not on file  Other Topics Concern   Not on file  Social History Narrative   Not on file   Social Determinants of Health   Financial Resource Strain: Not on file  Food Insecurity: Not on file  Transportation Needs: Not on file  Physical Activity: Not on file  Stress: Not on file  Social Connections: Not on file  Intimate Partner Violence: Not on file    Physical Exam: Vital signs in last 24 hours: '@BP'$  108/61   Pulse 64   Temp 97.8 F (36.6 C)   Ht '5\' 10"'$  (1.778 m)   Wt 213 lb (96.6 kg)   SpO2 95%   BMI 30.56 kg/m  GEN: NAD EYE: Sclerae anicteric ENT: MMM CV: Non-tachycardic Pulm: CTA b/l GI: Soft, NT/ND NEURO:  Alert & Oriented x 3   Gerrit Heck, DO [facility]   10/08/2022 7:54 AM

## 2022-10-08 NOTE — Progress Notes (Signed)
Pt's states no medical or surgical changes since previsit or office visit. 

## 2022-10-08 NOTE — Progress Notes (Signed)
Vss nad trans to pacu °

## 2022-10-08 NOTE — Progress Notes (Unsigned)
Called to room to assist during endoscopic procedure.  Patient ID and intended procedure confirmed with present staff. Received instructions for my participation in the procedure from the performing physician.  

## 2022-10-08 NOTE — Patient Instructions (Addendum)
- Resume previous diet.                           - Use Protonix (pantoprazole) 40 mg PO BID for 8                            weeks to promote mucosal healing, then reduce back                            to 40 mg daily. Will plan on repeat EGD to be done                            while still on high dose PPI.                           - Await pathology results.                           - Repeat upper endoscopy in 6 weeks to check                            healing and for retreatment.                            - Return to GI office at appointment to be                            scheduled after repeat EGD.   Repeat EGD onThursday April 11 at 10:00 am.   YOU HAD AN ENDOSCOPIC PROCEDURE TODAY AT [facility]:   Refer to the procedure report that was given to you for any specific questions about what was found during the examination.  If the procedure report does not answer your questions, please call your gastroenterologist to clarify.  If you requested that your care partner not be given the details of your procedure findings, then the procedure report has been included in a sealed envelope for you to review at your convenience later.  YOU SHOULD EXPECT: Some feelings of bloating in the abdomen. Passage of more gas than usual.  Walking can help get rid of the air that was put into your GI tract during the procedure and reduce the bloating. If you had a lower endoscopy (such as a colonoscopy or flexible sigmoidoscopy) you may notice spotting of blood in your stool or on the toilet paper. If you underwent a bowel prep for your procedure, you may not have a normal bowel movement for a few days.  Please Note:  You might notice some irritation and congestion in your nose or some drainage.  This is from the oxygen used during your procedure.  There is no need for concern and it should clear up in a day or so.  SYMPTOMS TO REPORT IMMEDIATELY:  Following  lower endoscopy (colonoscopy or flexible sigmoidoscopy):  Excessive amounts of blood in the stool  Significant tenderness or worsening of abdominal pains  Swelling of the abdomen that is new, acute  Fever of 100F or higher   For urgent or emergent issues, a gastroenterologist can be reached at any hour  by calling [phone]. Do not use MyChart messaging for urgent concerns.    DIET:  We do recommend a small meal at first, but then you may proceed to your regular diet.  Drink plenty of fluids but you should avoid alcoholic beverages for 24 hours.  ACTIVITY:  You should plan to take it easy for the rest of today and you should NOT DRIVE or use heavy machinery until tomorrow (because of the sedation medicines used during the test).    FOLLOW UP: Our staff will call the number listed on your records the next business day following your procedure.  We will call around 7:15- 8:00 am to check on you and address any questions or concerns that you may have regarding the information given to you following your procedure. If we do not reach you, we will leave a message.     If any biopsies were taken you will be contacted by phone or by letter within the next 1-3 weeks.  Please call us at [phone] if you have not heard about the biopsies in 3 weeks.    SIGNATURES/CONFIDENTIALITY: You and/or your care partner have signed paperwork which will be entered into your electronic medical record.  These signatures attest to the fact that that the information above on your After Visit Summary has been reviewed and is understood.  Full responsibility of the confidentiality of this discharge information lies with you and/or your care-partner.

## 2022-10-08 NOTE — Op Note (Signed)
[place] Patient Name: Maxwell Herrera Procedure Date: 10/08/2022 7:29 AM MRN: [id number] Endoscopist: Gerrit Heck , MD, [id number] Age: 37 Referring MD:  Date of Birth: 1985/10/12 Gender: Male Account #: [id number] Procedure:                Upper GI endoscopy Indications:              Dysphagia, Esophageal reflux, Stricture of the                            esophagus, For therapy of esophageal stricture,                            Nausea Medicines:                Monitored Anesthesia Care Procedure:                Pre-Anesthesia Assessment:                           - Prior to the procedure, a History and Physical                            was performed, and patient medications and                            allergies were reviewed. The patient's tolerance of                            previous anesthesia was also reviewed. The risks                            and benefits of the procedure and the sedation                            options and risks were discussed with the patient.                            All questions were answered, and informed consent                            was obtained. Prior Anticoagulants: The patient has                            taken no anticoagulant or antiplatelet agents. ASA                            Grade Assessment: II - A patient with mild systemic                            disease. After reviewing the risks and benefits,                            the patient was deemed in satisfactory condition to  undergo the procedure.                           After obtaining informed consent, the endoscope was                            passed under direct vision. Throughout the                            procedure, the patient's blood pressure, pulse, and                            oxygen saturations were monitored continuously. The                            Olympus Scope [id number] was introduced through the                             mouth, and advanced to the duodenal bulb. The upper                            GI endoscopy was accomplished without difficulty.                            The patient tolerated the procedure well. Scope In: Scope Out: Findings:                 One benign-appearing, intrinsic moderate stenosis                            was found 37 cm from the incisors. This stenosis                            measured 1 cm (in length). The stenosis was                            traversed. There was a small, but appropriate,                            mucosal rent after endoscope passage alone. Givent                            the small mucosal rent and the presence of food in                            the stomach and esophagus, further esophageal                            balloon dilation was not performed at this time due                            to increased risks at that point.  A single 6 mm nodule was found at the                            gastroesophageal junction, 38 cm from the incisors.                            Biopsies were taken with a cold forceps for                            histology. Estimated blood loss was minimal.                           Esophagitis with no bleeding was found in the lower                            third of the esophagus. This was characterized by                            edema, loss of vascularity and evidence of chronic                            inflammatory change. There was retained food in the                            esophagus which was advanced into the stomach and                            fluid suctioned through the endoscope.                           A 2 cm hiatal hernia was present.                           A large amount of food (residue) was found in the                            entire examined stomach. This precluded adequate                            visualization and limited time for  safe sedation. Complications:            No immediate complications. Estimated Blood Loss:     Estimated blood loss was minimal. Impression:               - Benign-appearing esophageal stenosis.                           - Nodule found in the esophagus. Biopsied.                           - Esophagitis with no bleeding.                           - 2 cm hiatal hernia.                           -  A large amount of food (residue) in the stomach. Recommendation:           - Patient has a contact number available for                            emergencies. The signs and symptoms of potential                            delayed complications were discussed with the                            patient. Return to normal activities tomorrow.                            Written discharge instructions were provided to the                            patient.                           - Resume previous diet.                           - Use Protonix (pantoprazole) 40 mg PO BID for 8                            weeks to promote mucosal healing, then reduce back                            to 40 mg daily. Will plan on repeat EGD to be done                            while still on high dose PPI.                           - Await pathology results.                           - Repeat upper endoscopy in 6 weeks to check                            healing and for retreatment.                           - Return to GI office at appointment to be                            scheduled after repeat EGD. Gerrit Heck, MD 10/08/2022 8:26:26 AM

## 2022-10-09 ENCOUNTER — Telehealth: Payer: Self-pay

## 2022-10-09 DIAGNOSIS — K3189 Other diseases of stomach and duodenum: Secondary | ICD-10-CM

## 2022-10-09 DIAGNOSIS — R112 Nausea with vomiting, unspecified: Secondary | ICD-10-CM

## 2022-10-09 NOTE — Telephone Encounter (Signed)
-----   Message from [place], DO sent at 10/08/2022  5:11 PM EST ----- Can you please send in referral for GES for evaluation of nausea, retained food in stomach on EGD.   Thanks!

## 2022-10-09 NOTE — Telephone Encounter (Signed)
GES order in epic. Secure staff message sent to radiology scheduling to contact patient to set up appt. MyChart message sent to patient with information.

## 2022-10-09 NOTE — Telephone Encounter (Signed)
  Follow up Call-     10/08/2022    7:24 AM 05/08/2021    7:45 AM  Call back number  Post procedure Call Back phone  # [phone]  Permission to leave phone message Yes Yes     Patient questions:  Do you have a fever, pain , or abdominal swelling? No. Pain Score  0 *  Have you tolerated food without any problems? Yes.    Have you been able to return to your normal activities? Yes.    Do you have any questions about your discharge instructions: Diet   No. Medications  No. Follow up visit  No.  Do you have questions or concerns about your Care? No.  Actions: * If pain score is 4 or above: No action needed, pain <4.

## 2022-10-13 ENCOUNTER — Encounter: Payer: Self-pay | Admitting: Gastroenterology

## 2022-10-14 ENCOUNTER — Other Ambulatory Visit: Payer: Self-pay | Admitting: Gastroenterology

## 2022-10-14 ENCOUNTER — Encounter: Payer: Self-pay | Admitting: Gastroenterology

## 2022-10-14 MED ORDER — SUCRALFATE 1 G PO TABS: ORAL_TABLET | ORAL | 1 refills | Status: DC

## 2022-10-15 NOTE — Telephone Encounter (Signed)
GES scheduled for 11/06/22 at 8 am

## 2022-10-16 ENCOUNTER — Other Ambulatory Visit: Payer: Self-pay

## 2022-10-16 DIAGNOSIS — K222 Esophageal obstruction: Secondary | ICD-10-CM

## 2022-10-16 DIAGNOSIS — R1310 Dysphagia, unspecified: Secondary | ICD-10-CM

## 2022-11-06 ENCOUNTER — Ambulatory Visit (HOSPITAL_COMMUNITY)
Admission: RE | Admit: 2022-11-06 | Discharge: 2022-11-06 | Disposition: A | Discharge status: Home / Self Care | Payer: No Typology Code available for payment source | Source: Ambulatory Visit | Attending: Gastroenterology | Admitting: Gastroenterology

## 2022-11-06 DIAGNOSIS — R112 Nausea with vomiting, unspecified: Secondary | ICD-10-CM | POA: Diagnosis present

## 2022-11-06 DIAGNOSIS — K3189 Other diseases of stomach and duodenum: Secondary | ICD-10-CM | POA: Diagnosis present

## 2022-11-10 ENCOUNTER — Telehealth: Payer: Self-pay

## 2022-11-10 ENCOUNTER — Other Ambulatory Visit: Payer: Self-pay | Admitting: Internal Medicine

## 2022-11-10 MED ORDER — METOCLOPRAMIDE HCL 5 MG PO TABS: ORAL_TABLET | ORAL | 1 refills | Status: DC

## 2022-11-10 NOTE — Telephone Encounter (Signed)
Patient made aware. Medication is sent to the pharmacy. Referral to [facility] will be placed.

## 2022-11-10 NOTE — Telephone Encounter (Signed)
-----   Message from [place] V, DO sent at 11/10/2022  8:23 AM EDT ----- Results from the gastric emptying study reviewed and consistent with delayed gastric emptying with only 59% emptying at 4 hours (normal >90%).  Based on young age, increasing symptoms, plan for the following: - Reglan 5 mg tid x 2 weeks, then take every 8 hours as needed.  Will take scheduled tid to start, with goal of changing to prn. #60, RF1 - Referral to [facility]

## 2022-11-11 ENCOUNTER — Encounter: Payer: Self-pay | Admitting: Gastroenterology

## 2022-11-11 NOTE — Telephone Encounter (Signed)
Referral to [facility] is faxed. Fax# [phone] Phone# [phone]

## 2022-11-13 ENCOUNTER — Encounter: Payer: No Typology Code available for payment source | Admitting: Gastroenterology

## 2022-11-24 ENCOUNTER — Encounter: Payer: Self-pay | Admitting: Gastroenterology

## 2022-11-24 ENCOUNTER — Other Ambulatory Visit: Payer: Self-pay

## 2022-11-24 MED ORDER — ONDANSETRON HCL 4 MG PO TABS: 4.0000 mg | ORAL_TABLET | Freq: Four times a day (QID) | ORAL | 3 refills | Status: DC | PRN

## 2022-12-09 NOTE — Telephone Encounter (Signed)
Patient is scheduled on 7/23 with  Dr. Shawn Stall at [facility].

## 2022-12-10 ENCOUNTER — Other Ambulatory Visit: Payer: Self-pay | Admitting: Gastroenterology

## 2022-12-10 MED ORDER — PROCHLORPERAZINE MALEATE 5 MG PO TABS: 5.0000 mg | ORAL_TABLET | Freq: Four times a day (QID) | ORAL | 3 refills | Status: DC | PRN

## 2022-12-10 NOTE — Telephone Encounter (Signed)
[facility] appt is 02/24/23.

## 2022-12-11 ENCOUNTER — Encounter: Payer: Self-pay | Admitting: Gastroenterology

## 2022-12-11 ENCOUNTER — Ambulatory Visit: Payer: No Typology Code available for payment source | Admitting: Gastroenterology

## 2022-12-11 VITALS — BP 112/77 | HR 55 | Temp 97.7°F | Resp 14 | Ht 70.0 in | Wt 213.0 lb

## 2022-12-11 DIAGNOSIS — R112 Nausea with vomiting, unspecified: Secondary | ICD-10-CM

## 2022-12-11 DIAGNOSIS — K222 Esophageal obstruction: Secondary | ICD-10-CM | POA: Diagnosis present

## 2022-12-11 DIAGNOSIS — K3184 Gastroparesis: Secondary | ICD-10-CM

## 2022-12-11 DIAGNOSIS — K3189 Other diseases of stomach and duodenum: Secondary | ICD-10-CM

## 2022-12-11 DIAGNOSIS — K259 Gastric ulcer, unspecified as acute or chronic, without hemorrhage or perforation: Secondary | ICD-10-CM | POA: Diagnosis not present

## 2022-12-11 DIAGNOSIS — R1310 Dysphagia, unspecified: Secondary | ICD-10-CM

## 2022-12-11 DIAGNOSIS — K229 Disease of esophagus, unspecified: Secondary | ICD-10-CM

## 2022-12-11 MED ORDER — SODIUM CHLORIDE 0.9 % IV SOLN
500.0000 mL | INTRAVENOUS | Status: DC
Start: 2022-12-11 — End: 2022-12-11

## 2022-12-11 MED ORDER — SODIUM CHLORIDE 0.9 % IV SOLN
500.0000 mL | Freq: Once | INTRAVENOUS | Status: DC
Start: 2022-12-11 — End: 2022-12-11

## 2022-12-11 NOTE — Op Note (Signed)
[facility] Patient Name: Maxwell Herrera Procedure Date: 12/11/2022 10:45 AM MRN: [id number] Endoscopist: Doristine Locks , MD, [id number] Age: 37 Referring MD:  Date of Birth: September 14, 1985 Gender: Male Account #: [id number] Procedure:                Upper GI endoscopy w/ biopsy Indications:              Dysphagia, Esophageal reflux, Follow-up of                            Barrett's esophagus, Gastroparesis, Nausea with                            vomiting                           ?" EGD (01/20/1992): Normal-appearing esophagus,                            stomach, and duodenum. 36 French Maloney dilator                            passed without difficulty                           ?" Colonoscopy (01/20/1992): Diffuse nodularity of                            the colon with biopsies obtained (path: Mild                            nonspecific chronic colitis). Otherwise                            normal-appearing.                           ?" EGD (05/08/2021, Dr. Barron Alvine): Stricture at 36                            cm dilated with 13 mm TTS balloon, esophagitis in                            the lower esophagus (path: Reflux changes), mild                            gastritis (path: H. pylori positive), 1 cm x 3 cm                            sliding hiatal hernia, normal duodenum. Started                            Protonix 40 mg BID x6 weeks. Completed quadruple                            therapy for H. pylori.                           ?"  10/08/2022: EGD: Stricture at 37 cm dilated with                            endoscope alone. 6 mm nodule at GE junction,                            located at 38 cm (path: Intestinal metaplasia).                            Esophagitis with retained food in the esophagus                            which was advanced into the stomach. 2 cm HH. Large                            amount of residual food in the stomach. Treated                             with high-dose PPI and presents today for repeat                            upper endoscopy for reevaluation and esophageal                            dilation.                           ?" 11/06/2022: GES: Delayed gastric emptying. Started                            on Reglan and referred to the [facility] Medicines:                Monitored Anesthesia Care Procedure:                Pre-Anesthesia Assessment:                           - Prior to the procedure, a History and Physical                            was performed, and patient medications and                            allergies were reviewed. The patient's tolerance of                            previous anesthesia was also reviewed. The risks                            and benefits of the procedure and the sedation                            options and risks were discussed with the patient.  All questions were answered, and informed consent                            was obtained. Prior Anticoagulants: The patient has                            taken no anticoagulant or antiplatelet agents. ASA                            Grade Assessment: II - A patient with mild systemic                            disease. After reviewing the risks and benefits,                            the patient was deemed in satisfactory condition to                            undergo the procedure.                           After obtaining informed consent, the endoscope was                            passed under direct vision. Throughout the                            procedure, the patient's blood pressure, pulse, and                            oxygen saturations were monitored continuously. The                            Olympus Scope [id number] was introduced through the                            mouth, and advanced to the second part of duodenum.                            The upper GI endoscopy was accomplished  without                            difficulty. The patient tolerated the procedure                            well. Scope In: Scope Out: Findings:                 Food was found in the middle third of the esophagus                            and in the lower third of the esophagus. This was  easily lavaged into the stomach. However, food                            residue readily and repeatedly refluxed into the                            esophagus from the stomach and there was paucity of                            esophageal contractions on this study.                           A single 5 mm nodule was found at the                            gastroesophageal junction, 38 cm from the incisors.                            Biopsies were taken with a cold forceps for                            histology. Estimated blood loss was minimal.                           One benign-appearing, intrinsic moderate stenosis                            was found 38 cm from the incisors. This stenosis                            measured 1 cm (in length). The stenosis was                            traversed. This was not dilated today due to                            significant retained food in the stomach.                           A large amount of food (residue) was found in the                            gastric fundus, in the gastric body and in the                            gastric antrum.                           The examined duodenum was normal. Complications:            No immediate complications. Estimated Blood Loss:     Estimated blood loss was minimal. Impression:               - Food in the middle third of the esophagus and  in                            the lower third of the esophagus.                           - Nodule found in the esophagus. Biopsied.                           - Benign-appearing esophageal stenosis.                           - A large amount  of food (residue) in the stomach.                           - Normal examined duodenum. Recommendation:           - Patient has a contact number available for                            emergencies. The signs and symptoms of potential                            delayed complications were discussed with the                            patient. Return to normal activities tomorrow.                            Written discharge instructions were provided to the                            patient.                           - Resume previous diet.                           - Continue present medications.                           - Await pathology results.                           - Keep appointment in the [facility] as                            scheduled.                           - For any repeat endoscopy with attempt at EMR of                            the nodule and/or dilation of the stricture, I                            recommend doing so with intubation.  Doristine Locks, MD 12/11/2022 11:04:46 AM

## 2022-12-11 NOTE — Progress Notes (Signed)
GASTROENTEROLOGY PROCEDURE H&P NOTE   Primary Care Physician: Patient, No Pcp Per    Reason for Procedure:  Esophageal stricture, dysphagia, Barrett's Esophagus, GERD  Plan:    EGD with dilation and/or biopsies  Patient is appropriate for endoscopic procedure(s) in the ambulatory ([facility]) setting.  The nature of the procedure, as well as the risks, benefits, and alternatives were carefully and thoroughly reviewed with the patient. Ample time for discussion and questions allowed. The patient understood, was satisfied, and agreed to proceed.     HPI: Maxwell Herrera is a 37 y.o. male who presents for EGD with plan for esophageal dilation and/or biopsies for follow-up on recent upper endoscopy and GES as outlined below.  Continues to have nausea.  Developed drowsiness with Reglan, so this was switched to Zofran, but then developed constipation.  More recently started on Compazine.  Has referral in place to the [facility] for evaluation/treatment of gastroparesis.  - EGD (01/20/1992): Normal-appearing esophagus, stomach, and duodenum.  36 French Maloney dilator passed without difficulty - Colonoscopy (01/20/1992): Diffuse nodularity of the colon with biopsies obtained (path: Mild nonspecific chronic colitis).  Otherwise normal-appearing. - EGD (05/08/2021, Dr. Barron Alvine): Stricture at 36 cm dilated with 13 mm TTS balloon, esophagitis in the lower esophagus (path: Reflux changes), mild gastritis (path: H. pylori positive), 1 cm x 3 cm sliding hiatal hernia, normal duodenum.  Started Protonix 40 mg BID x6 weeks.  Completed quadruple therapy for H. pylori.  - 10/08/2022: EGD: Stricture at 37 cm dilated with endoscope alone.  6 mm nodule at GE junction, located at 38 cm (path: Intestinal metaplasia).  Esophagitis with retained food in the esophagus which was advanced into the stomach.  2 cm HH.  Large amount of residual food in the stomach.  Treated with high-dose PPI and presents today for repeat  upper endoscopy for reevaluation and esophageal dilation. - 11/06/2022: GES: Delayed gastric emptying.  Started on Reglan and referred to the [facility]  With regards to the possible nodular Barrett's Esophagus, patient was discussed with the advanced GI service with plan for repeat upper endoscopy after trial of high-dose PPI and if nodule remains, plan for referral for EUS and EMR.  Past Medical History:  Diagnosis Date   ADHD (attention deficit hyperactivity disorder) 08/11/2011   ALLERGIC RHINITIS 03/21/2007   ALLERGY, HX OF 03/19/2007   ANXIETY 03/21/2007   ASTHMA 12/12/2008   IBS 03/19/2007   OBESITY 03/19/2007   SINUSITIS- ACUTE-NOS 12/27/2009    Past Surgical History:  Procedure Laterality Date   CHOLECYSTECTOMY N/A 09/29/2016   Procedure: LAPAROSCOPIC CHOLECYSTECTOMY WITH INTRAOPERATIVE CHOLANGIOGRAM;  Surgeon: Abigail Miyamoto, MD;  Location: [facility] OR;  Service: General;  Laterality: N/A;   STOMACH SURGERY     s/p as toddler for malposition   TONSILLECTOMY     UPPER GASTROINTESTINAL ENDOSCOPY      Prior to Admission medications   Medication Sig Start Date End Date Taking? Authorizing Provider  amphetamine-dextroamphetamine (ADDERALL) 20 MG tablet Take 20 mg by mouth 2 (two) times daily. 02/28/21  Yes [provider]  clonazePAM (KLONOPIN) 1 MG tablet Take 1/2 to 1 tab twice a day as needed 03/11/17  Yes [provider]  DULoxetine (CYMBALTA) 60 MG capsule Take 2 caps daily 01/29/21  Yes [provider]  lamoTRIgine (LAMICTAL) 100 MG tablet Indications: psychiatric disorder 04/04/22  Yes [provider]  pantoprazole (PROTONIX) 40 MG tablet TAKE 1 TABLET BY MOUTH EVERY DAY 02/07/22  Yes Corwin Levins, MD  vitamin B-12 (CYANOCOBALAMIN) 1000 MCG tablet Take 1 tablet (1,000 mcg total) by mouth daily. 01/05/20  Yes Corwin Levins, MD  famotidine (PEPCID) 20 MG tablet TAKE 1 TABLET (20 MG TOTAL) BY MOUTH DAILY AS NEEDED FOR HEARTBURN OR INDIGESTION. 04/09/22  05/09/22  Kamron Portee V, DO  famotidine (PEPCID) 20 MG tablet Take by mouth. 04/09/22   [provider]  fexofenadine (ALLEGRA) 180 MG tablet Take 1 tablet (180 mg total) by mouth daily. 06/25/20 02/19/22  Corwin Levins, MD  prochlorperazine (COMPAZINE) 5 MG tablet Take 1 tablet (5 mg total) by mouth every 6 (six) hours as needed for nausea or vomiting. 12/10/22   Jennilyn Esteve V, DO  sucralfate (CARAFATE) 1 g tablet Take 1 tablet (1 g total) by mouth 2 (two) times daily. Take one tablet by mouth four times a day for two weeks then take one tablet by mouth twice a day 12/10/22   Vesta Wheeland, Verlin Dike, DO    Current Outpatient Medications  Medication Sig Dispense Refill   amphetamine-dextroamphetamine (ADDERALL) 20 MG tablet Take 20 mg by mouth 2 (two) times daily.     clonazePAM (KLONOPIN) 1 MG tablet Take 1/2 to 1 tab twice a day as needed     DULoxetine (CYMBALTA) 60 MG capsule Take 2 caps daily     lamoTRIgine (LAMICTAL) 100 MG tablet Indications: psychiatric disorder     pantoprazole (PROTONIX) 40 MG tablet TAKE 1 TABLET BY MOUTH EVERY DAY 90 tablet 3   vitamin B-12 (CYANOCOBALAMIN) 1000 MCG tablet Take 1 tablet (1,000 mcg total) by mouth daily. 90 tablet 1   famotidine (PEPCID) 20 MG tablet TAKE 1 TABLET (20 MG TOTAL) BY MOUTH DAILY AS NEEDED FOR HEARTBURN OR INDIGESTION. 90 tablet 1   famotidine (PEPCID) 20 MG tablet Take by mouth.     fexofenadine (ALLEGRA) 180 MG tablet Take 1 tablet (180 mg total) by mouth daily. 30 tablet 2   prochlorperazine (COMPAZINE) 5 MG tablet Take 1 tablet (5 mg total) by mouth every 6 (six) hours as needed for nausea or vomiting. 60 tablet 3   sucralfate (CARAFATE) 1 g tablet Take 1 tablet (1 g total) by mouth 2 (two) times daily. Take one tablet by mouth four times a day for two weeks then take one tablet by mouth twice a day 90 tablet 1   Current Facility-Administered Medications  Medication Dose Route Frequency Provider Last Rate Last Admin   0.9 %   sodium chloride infusion  500 mL Intravenous Continuous Takya Vandivier V, DO       0.9 %  sodium chloride infusion  500 mL Intravenous Continuous Kitt Minardi V, DO        Allergies as of 12/11/2022   (No Active Allergies)    Family History  Problem Relation Age of Onset   Diabetes Mellitus I Mother    Thyroid cancer Mother    Diabetes Father    Asthma Other    Colon cancer Neg Hx    Esophageal cancer Neg Hx    Rectal cancer Neg Hx     Social History   Socioeconomic History   Marital status: Single    Spouse name: Not on file   Number of children: Not on file   Years of education: Not on file   Highest education level: Not on file  Occupational History   Occupation: case Production designer, theatre/television/film  Tobacco Use   Smoking status: Never   Smokeless tobacco: Never  Vaping Use   Vaping  Use: Never used  Substance and Sexual Activity   Alcohol use: No   Drug use: No   Sexual activity: Not on file  Other Topics Concern   Not on file  Social History Narrative   Not on file   Social Determinants of Health   Financial Resource Strain: Not on file  Food Insecurity: Not on file  Transportation Needs: Not on file  Physical Activity: Not on file  Stress: Not on file  Social Connections: Not on file  Intimate Partner Violence: Not on file    Physical Exam: Vital signs in last 24 hours: @BP  112/72   Pulse (!) 48   Temp 97.7 F (36.5 C)   Ht 5\' 10"  (1.778 m)   Wt 213 lb (96.6 kg)   SpO2 98%   BMI 30.56 kg/m  GEN: NAD EYE: Sclerae anicteric ENT: MMM CV: Non-tachycardic Pulm: CTA b/l GI: Soft, NT/ND NEURO:  Alert & Oriented x 3   Doristine Locks, DO [facility]   12/11/2022 10:40 AM

## 2022-12-11 NOTE — Progress Notes (Signed)
Called to room to assist during endoscopic procedure.  Patient ID and intended procedure confirmed with present staff. Received instructions for my participation in the procedure from the performing physician.  

## 2022-12-11 NOTE — Progress Notes (Signed)
Pt resting comfortably. VSS. Airway intact. SBAR complete to RN. All questions answered.   

## 2022-12-11 NOTE — Progress Notes (Signed)
Pt's states no medical or surgical changes since previsit or office visit. 

## 2022-12-11 NOTE — Patient Instructions (Signed)
-  await pathology results -Keep appointment with [facility] as scheduled. -Continue present medications   YOU HAD AN ENDOSCOPIC PROCEDURE TODAY AT THE [facility]:   Refer to the procedure report that was given to you for any specific questions about what was found during the examination.  If the procedure report does not answer your questions, please call your gastroenterologist to clarify.  If you requested that your care partner not be given the details of your procedure findings, then the procedure report has been included in a sealed envelope for you to review at your convenience later.  YOU SHOULD EXPECT: Some feelings of bloating in the abdomen. Passage of more gas than usual.  Walking can help get rid of the air that was put into your GI tract during the procedure and reduce the bloating. If you had a lower endoscopy (such as a colonoscopy or flexible sigmoidoscopy) you may notice spotting of blood in your stool or on the toilet paper. If you underwent a bowel prep for your procedure, you may not have a normal bowel movement for a few days.  Please Note:  You might notice some irritation and congestion in your nose or some drainage.  This is from the oxygen used during your procedure.  There is no need for concern and it should clear up in a day or so.  SYMPTOMS TO REPORT IMMEDIATELY:  Following upper endoscopy (EGD)  Vomiting of blood or coffee ground material  New chest pain or pain under the shoulder blades  Painful or persistently difficult swallowing  New shortness of breath  Fever of 100F or higher  Black, tarry-looking stools  For urgent or emergent issues, a gastroenterologist can be reached at any hour by calling [phone]. Do not use MyChart messaging for urgent concerns.    DIET:  We do recommend a small meal at first, but then you may proceed to your regular diet.  Drink plenty of fluids but you should avoid alcoholic beverages for 24  hours.  ACTIVITY:  You should plan to take it easy for the rest of today and you should NOT DRIVE or use heavy machinery until tomorrow (because of the sedation medicines used during the test).    FOLLOW UP: Our staff will call the number listed on your records the next business day following your procedure.  We will call around 7:15- 8:00 am to check on you and address any questions or concerns that you may have regarding the information given to you following your procedure. If we do not reach you, we will leave a message.     If any biopsies were taken you will be contacted by phone or by letter within the next 1-3 weeks.  Please call us at [phone] if you have not heard about the biopsies in 3 weeks.    SIGNATURES/CONFIDENTIALITY: You and/or your care partner have signed paperwork which will be entered into your electronic medical record.  These signatures attest to the fact that that the information above on your After Visit Summary has been reviewed and is understood.  Full responsibility of the confidentiality of this discharge information lies with you and/or your care-partner.

## 2022-12-12 ENCOUNTER — Telehealth: Payer: Self-pay | Admitting: *Deleted

## 2022-12-12 MED ORDER — METOCLOPRAMIDE HCL 5 MG PO TABS: 5.0000 mg | ORAL_TABLET | Freq: Four times a day (QID) | ORAL | 2 refills | Status: AC | PRN

## 2022-12-12 NOTE — Telephone Encounter (Signed)
  Follow up Call-     12/11/2022    9:23 AM 10/08/2022    7:24 AM 05/08/2021    7:45 AM  Call back number  Post procedure Call Back phone  # [phone]  Permission to leave phone message Yes Yes Yes     Patient questions:  Do you have a fever, pain , or abdominal swelling? No. Pain Score  0 *  Have you tolerated food without any problems? Yes.    Have you been able to return to your normal activities? Yes.    Do you have any questions about your discharge instructions: Diet   No. Medications  No. Follow up visit  No.  Do you have questions or concerns about your Care? No.  Actions: * If pain score is 4 or above: No action needed, pain <4.

## 2022-12-12 NOTE — Addendum Note (Signed)
Addended by: Missy Sabins on: 12/12/2022 01:46 PM   Modules accepted: Orders

## 2023-02-02 ENCOUNTER — Encounter: Payer: Self-pay | Admitting: Gastroenterology

## 2023-02-02 MED ORDER — SUCRALFATE 1 G PO TABS: 1.0000 g | ORAL_TABLET | Freq: Two times a day (BID) | ORAL | 0 refills | Status: DC

## 2023-05-15 ENCOUNTER — Other Ambulatory Visit: Payer: Self-pay | Admitting: Gastroenterology

## 2023-05-18 ENCOUNTER — Encounter: Payer: Self-pay | Admitting: Gastroenterology

## 2023-05-18 ENCOUNTER — Other Ambulatory Visit: Payer: Self-pay | Admitting: Gastroenterology

## 2023-05-18 MED ORDER — SUCRALFATE 1 G PO TABS: 1.0000 g | ORAL_TABLET | Freq: Two times a day (BID) | ORAL | 0 refills | Status: AC
